# Patient Record
Sex: Female | Born: 1970 | Race: Black or African American | Hispanic: No | Marital: Single | Smoking: Former smoker
Health system: Southern US, Community
[De-identification: ages and names within clinical notes are randomized; demographics above are authoritative.]

## PROBLEM LIST (undated history)

## (undated) DIAGNOSIS — M329 Systemic lupus erythematosus, unspecified: Secondary | ICD-10-CM

## (undated) DIAGNOSIS — IMO0002 Reserved for concepts with insufficient information to code with codable children: Secondary | ICD-10-CM

## (undated) DIAGNOSIS — M25569 Pain in unspecified knee: Secondary | ICD-10-CM

## (undated) DIAGNOSIS — R7303 Prediabetes: Secondary | ICD-10-CM

## (undated) HISTORY — DX: Prediabetes: R73.03

## (undated) HISTORY — DX: Pain in unspecified knee: M25.569

## (undated) HISTORY — DX: Reserved for concepts with insufficient information to code with codable children: IMO0002

## (undated) HISTORY — DX: Systemic lupus erythematosus, unspecified: M32.9

---

## 1995-02-06 HISTORY — PX: KNEE SURGERY: SHX244

## 1996-10-01 DIAGNOSIS — M329 Systemic lupus erythematosus, unspecified: Secondary | ICD-10-CM | POA: Insufficient documentation

## 2010-07-19 ENCOUNTER — Emergency Department (HOSPITAL_BASED_OUTPATIENT_CLINIC_OR_DEPARTMENT_OTHER)
Admission: EM | Admit: 2010-07-19 | Discharge: 2010-07-19 | Disposition: A | Discharge status: Home / Self Care | Payer: 59 | Attending: Emergency Medicine | Admitting: Emergency Medicine

## 2010-07-19 DIAGNOSIS — R21 Rash and other nonspecific skin eruption: Secondary | ICD-10-CM | POA: Insufficient documentation

## 2010-07-19 DIAGNOSIS — M329 Systemic lupus erythematosus, unspecified: Secondary | ICD-10-CM | POA: Insufficient documentation

## 2010-07-19 LAB — URINALYSIS, ROUTINE W REFLEX MICROSCOPIC
Bilirubin Urine: NEGATIVE
Ketones, ur: NEGATIVE mg/dL
Specific Gravity, Urine: 1.006 (ref 1.005–1.030)
Urobilinogen, UA: 0.2 mg/dL (ref 0.0–1.0)
pH: 7 (ref 5.0–8.0)

## 2010-07-19 LAB — BASIC METABOLIC PANEL
BUN: 9 mg/dL (ref 6–23)
CO2: 22 mEq/L (ref 19–32)
Glucose, Bld: 108 mg/dL — ABNORMAL HIGH (ref 70–99)
Potassium: 4.1 mEq/L (ref 3.5–5.1)
Sodium: 134 mEq/L — ABNORMAL LOW (ref 135–145)

## 2010-07-19 LAB — CBC
HCT: 37.1 % (ref 36.0–46.0)
MCH: 28.5 pg (ref 26.0–34.0)
MCV: 82.1 fL (ref 78.0–100.0)
Platelets: 174 10*3/uL (ref 150–400)
RDW: 13.2 % (ref 11.5–15.5)
WBC: 2.6 10*3/uL — ABNORMAL LOW (ref 4.0–10.5)

## 2010-07-19 LAB — DIFFERENTIAL
Eosinophils Absolute: 0 10*3/uL (ref 0.0–0.7)
Eosinophils Relative: 0 % (ref 0–5)
Lymphocytes Relative: 8 % — ABNORMAL LOW (ref 12–46)
Lymphs Abs: 0.2 10*3/uL — ABNORMAL LOW (ref 0.7–4.0)
Monocytes Relative: 4 % (ref 3–12)

## 2011-10-25 DIAGNOSIS — F419 Anxiety disorder, unspecified: Secondary | ICD-10-CM | POA: Insufficient documentation

## 2013-02-05 HISTORY — PX: SHOULDER SURGERY: SHX246

## 2013-03-16 DIAGNOSIS — H40003 Preglaucoma, unspecified, bilateral: Secondary | ICD-10-CM | POA: Insufficient documentation

## 2013-03-16 DIAGNOSIS — H2513 Age-related nuclear cataract, bilateral: Secondary | ICD-10-CM | POA: Insufficient documentation

## 2013-12-08 DIAGNOSIS — M25519 Pain in unspecified shoulder: Secondary | ICD-10-CM | POA: Insufficient documentation

## 2014-01-05 DIAGNOSIS — S43439A Superior glenoid labrum lesion of unspecified shoulder, initial encounter: Secondary | ICD-10-CM | POA: Insufficient documentation

## 2014-01-05 DIAGNOSIS — M24419 Recurrent dislocation, unspecified shoulder: Secondary | ICD-10-CM | POA: Insufficient documentation

## 2014-01-21 DIAGNOSIS — M25312 Other instability, left shoulder: Secondary | ICD-10-CM | POA: Insufficient documentation

## 2014-02-05 HISTORY — PX: UTERINE FIBROID SURGERY: SHX826

## 2015-01-14 DIAGNOSIS — R9431 Abnormal electrocardiogram [ECG] [EKG]: Secondary | ICD-10-CM | POA: Insufficient documentation

## 2020-04-19 DIAGNOSIS — M25562 Pain in left knee: Secondary | ICD-10-CM | POA: Insufficient documentation

## 2020-05-23 DIAGNOSIS — M179 Osteoarthritis of knee, unspecified: Secondary | ICD-10-CM | POA: Insufficient documentation

## 2020-05-23 DIAGNOSIS — Z6841 Body Mass Index (BMI) 40.0 and over, adult: Secondary | ICD-10-CM | POA: Insufficient documentation

## 2020-06-22 ENCOUNTER — Ambulatory Visit: Payer: BC Managed Care – PPO | Admitting: Internal Medicine

## 2020-08-09 ENCOUNTER — Other Ambulatory Visit: Payer: Self-pay | Admitting: Family Medicine

## 2020-08-09 DIAGNOSIS — Z1231 Encounter for screening mammogram for malignant neoplasm of breast: Secondary | ICD-10-CM

## 2020-09-30 ENCOUNTER — Ambulatory Visit: Payer: BC Managed Care – PPO

## 2020-10-06 ENCOUNTER — Ambulatory Visit: Payer: BC Managed Care – PPO

## 2020-11-10 ENCOUNTER — Other Ambulatory Visit: Payer: Self-pay

## 2020-11-10 ENCOUNTER — Ambulatory Visit
Admission: RE | Admit: 2020-11-10 | Discharge: 2020-11-10 | Disposition: A | Discharge status: Home / Self Care | Payer: BC Managed Care – PPO | Source: Ambulatory Visit | Attending: Family Medicine | Admitting: Family Medicine

## 2020-11-10 DIAGNOSIS — Z1231 Encounter for screening mammogram for malignant neoplasm of breast: Secondary | ICD-10-CM

## 2020-12-01 ENCOUNTER — Institutional Professional Consult (permissible substitution): Payer: BC Managed Care – PPO | Admitting: Internal Medicine

## 2020-12-12 ENCOUNTER — Ambulatory Visit (INDEPENDENT_AMBULATORY_CARE_PROVIDER_SITE_OTHER): Payer: BC Managed Care – PPO | Admitting: Pulmonary Disease

## 2020-12-12 ENCOUNTER — Other Ambulatory Visit: Payer: Self-pay

## 2020-12-12 ENCOUNTER — Encounter: Payer: Self-pay | Admitting: Pulmonary Disease

## 2020-12-12 VITALS — BP 122/78 | HR 80 | Temp 98.1°F | Ht 65.0 in | Wt 339.0 lb

## 2020-12-12 DIAGNOSIS — G4733 Obstructive sleep apnea (adult) (pediatric): Secondary | ICD-10-CM | POA: Diagnosis not present

## 2020-12-12 DIAGNOSIS — R0602 Shortness of breath: Secondary | ICD-10-CM | POA: Diagnosis not present

## 2020-12-12 LAB — CBC WITH DIFFERENTIAL/PLATELET
Basophils Absolute: 0 10*3/uL (ref 0.0–0.1)
Basophils Relative: 1.2 % (ref 0.0–3.0)
Eosinophils Absolute: 0 10*3/uL (ref 0.0–0.7)
Eosinophils Relative: 1.4 % (ref 0.0–5.0)
HCT: 37.8 % (ref 36.0–46.0)
Hemoglobin: 12.7 g/dL (ref 12.0–15.0)
Lymphocytes Relative: 53.8 % — ABNORMAL HIGH (ref 12.0–46.0)
Lymphs Abs: 2 10*3/uL (ref 0.7–4.0)
MCHC: 33.8 g/dL (ref 30.0–36.0)
MCV: 83.3 fl (ref 78.0–100.0)
Monocytes Absolute: 0.3 10*3/uL (ref 0.1–1.0)
Monocytes Relative: 9.6 % (ref 3.0–12.0)
Neutro Abs: 1.2 10*3/uL — ABNORMAL LOW (ref 1.4–7.7)
Neutrophils Relative %: 34 % — ABNORMAL LOW (ref 43.0–77.0)
Platelets: 279 10*3/uL (ref 150.0–400.0)
RBC: 4.53 Mil/uL (ref 3.87–5.11)
RDW: 13.8 % (ref 11.5–15.5)
WBC: 3.6 10*3/uL — ABNORMAL LOW (ref 4.0–10.5)

## 2020-12-12 NOTE — Patient Instructions (Signed)
We will check some labs today including CBC with differential, IgE  We will get records from Dr. Kathi Ludwig, rheumatology at [facility]  Schedule pulmonary function test and high-resolution CT for better evaluation of the lungs Schedule home sleep study Follow-up in 2 to 3 months

## 2020-12-12 NOTE — Progress Notes (Addendum)
Julia Booth    [id number]    Sep 14, 1970  Primary Care Physician:Collins, Hinton Dyer, DO  Referring Physician: Janie Morning, [place] [place],  [place] [address]  Chief complaint:  Consult for dyspnea  HPI: 50 year old with history of hypertension, hyperlipidemia, allergies, sleep apnea, lupus Here to establish care Complains of chronic dyspnea on exertion for the past 8 months.  She gets symptomatic when laying down.  She also has dyspnea on exertion with chest tightness.  Occasional wheezing  She had an echocardiogram done which showed normal LV function and has been referred to cardiology History notable for lupus diagnosed in 1998.  She was initially followed by Dr.Semble at [facility] and then followed with a rheumatologist at [facility].  She has reestablished care with Dr. Dossie Der at [facility] recently Lupus was initially treated with steroids in 2019 and has been maintained on Plaquenil.  Symptoms are mostly joint pain and fatigue  She has history of sleep apnea and is on AutoSet CPAP 5-20.  She had a sleep study done many years ago in [place] and does not have a sleep doctor here  She had COVID-19 in March 2022.  She was evaluated at urgent care and did not require hospitalization.  She was put on steroids for 1 week at that time  I have reviewed her primary care, urgent care visit and other records  Pets: 2 turtles.  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  No feather pillows or comforter Smoking history: Smokes 1 to 2 cigarettes/month for stress relief Travel history: Originally from [place].  She moved to [facility] for 6 years until 2021 Relevant family history: Sister had asthma.  No other significant family history of lung disease   Outpatient Encounter Medications as of 12/12/2020  Medication Sig   carvedilol (COREG) 6.25  MG tablet Take 6.25 mg by mouth 2 (two) times daily.   hydroxychloroquine (PLAQUENIL) 200 MG tablet Take 200 mg by mouth 2 (two) times daily.   spironolactone (ALDACTONE) 50 MG tablet Take 50 mg by mouth daily.   traMADol HCl 100 MG TABS tramadol 100 mg tablet   No facility-administered encounter medications on file as of 12/12/2020.    Allergies as of 12/12/2020 - Review Complete 12/12/2020  Allergen Reaction Noted   Penicillins Hives, Other (See Comments), and Rash 04/20/2011   Amoxicillin-pot clavulanate Rash 10/25/2011    No past medical history on file.  History reviewed. No pertinent surgical history.  Family History  Problem Relation Age of Onset   Breast cancer Maternal Grandmother     Social History   Socioeconomic History   Marital status: Single    Spouse name: Not on file   Number of children: Not on file   Years of education: Not on file   Highest education level: Not on file  Occupational History   Not on file  Tobacco Use   Smoking status: Some Days    Types: Cigarettes   Smokeless tobacco: Never   Tobacco comments:    Smokes with stress- 1 time a month average  Substance and Sexual Activity   Alcohol use: Not on file   Drug use: Not on file   Sexual activity: Not on file  Other Topics Concern   Not on file  Social History Narrative   Not on file   Social Determinants of Health  Financial Resource Strain: Not on file  Food Insecurity: Not on file  Transportation Needs: Not on file  Physical Activity: Not on file  Stress: Not on file  Social Connections: Not on file  Intimate Partner Violence: Not on file    Review of systems: Review of Systems  Constitutional: Negative for fever and chills.  HENT: Negative.   Eyes: Negative for blurred vision.  Respiratory: as per HPI  Cardiovascular: Negative for chest pain and palpitations.  Gastrointestinal: Negative for vomiting, diarrhea, blood per rectum. Genitourinary: Negative for dysuria,  urgency, frequency and hematuria.  Musculoskeletal: Negative for myalgias, back pain and joint pain.  Skin: Negative for itching and rash.  Neurological: Negative for dizziness, tremors, focal weakness, seizures and loss of consciousness.  Endo/Heme/Allergies: Negative for environmental allergies.  Psychiatric/Behavioral: Negative for depression, suicidal ideas and hallucinations.  All other systems reviewed and are negative.  Physical Exam: Blood pressure 122/78, pulse 80, temperature 98.1 F (36.7 C), temperature source Oral, height 5\' 5"  (1.651 m), weight (!) 339 lb (153.8 kg), SpO2 98 %. Gen:      No acute distress HEENT:  EOMI, sclera anicteric Neck:     No masses; no thyromegaly Lungs:    Clear to auscultation bilaterally; normal respiratory effort CV:         Regular rate and rhythm; no murmurs Abd:      + bowel sounds; soft, non-tender; no palpable masses, no distension Ext:    No edema; adequate peripheral perfusion Skin:      Warm and dry; no rash Neuro: alert and oriented x 3 Psych: normal mood and affect  Data Reviewed: Imaging:  PFTs:  Labs:  Cardiac: Echocardiogram 05/19/2020 Outside report Normal LV size, normal diastolic filling and systolic function.  Calculated EF 75% Normal RV size.  LA and RA cavity is mildly dilated  Assessment:  Consult for dyspnea Likely has multifactorial etiology of dyspnea Will need evaluation for reactive airway disease, lupus ILD or post-COVID syndrome Check CBC with differential, IgE Schedule PFTs and high-res CT Obtain records from Dr. 05/21/2020, rheumatology  We discussed empiric use of inhalers but have decided to hold off until we are able to review the data Encouraged weight loss with diet and exercise as body habitus is playing a role in presentation  OSA Maintained on CPAP She would like to transfer OSA care to Kathi Ludwig Order home sleep study for evaluation as the last sleep test was done many years ago in  [place]  Plan/Recommendations: Obtain rheumatology records CBC, IgE PFTs high-res CT Weight loss Home sleep study  [place] MD [facility] 12/12/2020, 9:01 AM  CC: 13/08/2020, DO  Addendum: Received note from Dr. Irena Reichmann, rheumatology dated 12/12/2020 She has a diagnosis of lupus, connective tissue disease overlap syndrome Checking lupus markers Trial of prednisone starting at 30 mg with taper Starting Benlysta.  Labs ANA positive RNP antibodies greater than 8  13/08/2020 MD [facility] 12/17/2020, 4:48 PM

## 2020-12-13 LAB — IGE: IgE (Immunoglobulin E), Serum: 159 kU/L — ABNORMAL HIGH (ref <=114)

## 2020-12-20 ENCOUNTER — Telehealth: Payer: Self-pay | Admitting: Pulmonary Disease

## 2020-12-20 NOTE — Telephone Encounter (Signed)
Labs are normal except for elevated IgE which may indicate allergies. No change in management   I have called the pt and LM on VM for her to call back.

## 2020-12-21 NOTE — Telephone Encounter (Signed)
Patient is returning phone call. Patient phone number is [phone]. May leave detailed message on voicemail.

## 2020-12-21 NOTE — Telephone Encounter (Signed)
I have called the pt and she is aware of results per PM>  nothing further is needed.

## 2021-01-02 ENCOUNTER — Ambulatory Visit
Admission: RE | Admit: 2021-01-02 | Discharge: 2021-01-02 | Disposition: A | Discharge status: Home / Self Care | Payer: BC Managed Care – PPO | Source: Ambulatory Visit | Attending: Pulmonary Disease | Admitting: Pulmonary Disease

## 2021-01-02 DIAGNOSIS — R0602 Shortness of breath: Secondary | ICD-10-CM

## 2021-01-05 ENCOUNTER — Other Ambulatory Visit: Payer: Self-pay

## 2021-01-05 ENCOUNTER — Ambulatory Visit (HOSPITAL_BASED_OUTPATIENT_CLINIC_OR_DEPARTMENT_OTHER): Payer: BC Managed Care – PPO | Admitting: Cardiovascular Disease

## 2021-01-05 ENCOUNTER — Encounter (HOSPITAL_BASED_OUTPATIENT_CLINIC_OR_DEPARTMENT_OTHER): Payer: Self-pay | Admitting: Cardiovascular Disease

## 2021-01-05 VITALS — BP 124/90 | HR 73 | Ht 65.0 in | Wt 349.0 lb

## 2021-01-05 DIAGNOSIS — I10 Essential (primary) hypertension: Secondary | ICD-10-CM | POA: Diagnosis not present

## 2021-01-05 DIAGNOSIS — R0609 Other forms of dyspnea: Secondary | ICD-10-CM | POA: Diagnosis not present

## 2021-01-05 DIAGNOSIS — I272 Pulmonary hypertension, unspecified: Secondary | ICD-10-CM | POA: Insufficient documentation

## 2021-01-05 DIAGNOSIS — G4733 Obstructive sleep apnea (adult) (pediatric): Secondary | ICD-10-CM

## 2021-01-05 DIAGNOSIS — Z5181 Encounter for therapeutic drug level monitoring: Secondary | ICD-10-CM

## 2021-01-05 DIAGNOSIS — R079 Chest pain, unspecified: Secondary | ICD-10-CM

## 2021-01-05 HISTORY — DX: Pulmonary hypertension, unspecified: I27.20

## 2021-01-05 HISTORY — DX: Obstructive sleep apnea (adult) (pediatric): G47.33

## 2021-01-05 HISTORY — DX: Essential (primary) hypertension: I10

## 2021-01-05 HISTORY — DX: Other forms of dyspnea: R06.09

## 2021-01-05 MED ORDER — FUROSEMIDE 20 MG PO TABS: 20.0000 mg | ORAL_TABLET | Freq: Every day | ORAL | 3 refills | Status: DC

## 2021-01-05 MED ORDER — METOPROLOL TARTRATE 100 MG PO TABS: ORAL_TABLET | ORAL | 0 refills | Status: DC

## 2021-01-05 NOTE — Assessment & Plan Note (Signed)
Blood pressure is well-controlled.  Continue carvedilol and spironolactone.

## 2021-01-05 NOTE — Assessment & Plan Note (Signed)
Pulmonary hypertension noted on echo.  There is a concern for ILD on chest CT.  She is working with pulmonary to work this up.  She is also using her CPAP regularly for OSA.  Given that she is had some lower extremity edema and her right atrial pressure was 8 mmHg on echo, we will start furosemide 20 mg daily.  Check a basic metabolic panel in a week and keep following up with pulmonary.

## 2021-01-05 NOTE — Assessment & Plan Note (Signed)
I suspect this is more related to her underlying lung disease, pulmonary hypertension, and morbid obesity.  However she also has some chest tightness.  We will get a coronary CTA to rule out obstructive coronary disease.  No significant calcification was noted on chest CT.

## 2021-01-05 NOTE — Progress Notes (Signed)
Cardiology Office Note:    Date:  01/05/2021   ID:  Julia Booth, DOB 1970/06/09, MRN [id number]  PCP:  Julia Reichmann, DO   [facility] Providers Cardiologist:  None     Referring MD: Julia Reichmann, DO   No chief complaint on file.  History of Present Illness:    Julia Booth is a 50 y.o. female with a hx of Lupus, hypertension, hyperlipidemia, and OSA, here for the evaluation of shortness of breath and edema. She saw Dr. Isaiah Booth 12/2020 and complained of dyspnea for the preceding 8 months. She also reported exertional dyspnea, chest tightness, and occasional wheezing. She reportedly had an Echo that revealed normal systolic function. She was dx with Lupus in 1998 and was seen by Dr. Janyth Booth at [facility]. She had COVID-19 on 04/2020. He thought her dyspnea was multifactorial. He was concerned for reactive airway disease, Lupus ILD, and post-COVID syndrome. She had a chest CT last week which showed early/mild ILD and was indeterminate for UIT. She had PFT's and a sleep test was ordered but has not been performed.  Today, she reports her shortness of breath has been ongoing periodically for 2 years. Earlier in March 2022 her shortness of breath was more frequent and associated with chest pains. Lately her episodes have been occurring mostly when she is lying down, or while sitting on the couch. She often needs to change position at night due to orthopnea. Her LE edema also began in the past 2 years, but usually improves by the next day. Additionally, she endorses myalgias due to Lupus. For exercise she walks her dog twice a day, and is on her recumbent bike for 30 minutes a day. While walking she will also become short of breath. However, she is able to push through if she rests for a short time. If she becomes very fatigued and short of breath, she also notices mucus production. Occasionally she smokes when she is stressed. A pack of cigarettes may last her for 2-3 months. She affirms  snoring, and uses her CPAP regularly. Last month her spironolactone was increased to 50 mg. She has not noticed much of a difference in terms of urinary frequency. She denies any palpitations. No lightheadedness, headaches, syncope, or PND.  Past Medical History:  Diagnosis Date   Essential hypertension 01/05/2021   Exertional dyspnea 01/05/2021   OSA (obstructive sleep apnea) 01/05/2021   Pulmonary hypertension, unspecified (HCC) 01/05/2021    History reviewed. No pertinent surgical history.  Current Medications: Current Meds  Medication Sig   carvedilol (COREG) 6.25 MG tablet Take 6.25 mg by mouth 2 (two) times daily.   furosemide (LASIX) 20 MG tablet Take 1 tablet (20 mg total) by mouth daily.   hydroxychloroquine (PLAQUENIL) 200 MG tablet Take 200 mg by mouth 2 (two) times daily.   metoprolol tartrate (LOPRESSOR) 100 MG tablet Take 1 tablet 2 hours prior to CT   spironolactone (ALDACTONE) 50 MG tablet Take 50 mg by mouth daily.   traMADol HCl 100 MG TABS tramadol 100 mg tablet     Allergies:   Penicillins and Amoxicillin-pot clavulanate   Social History   Socioeconomic History   Marital status: Single    Spouse name: Not on file   Number of children: Not on file   Years of education: Not on file   Highest education level: Not on file  Occupational History   Not on file  Tobacco Use   Smoking status: Some Days    Types: Cigarettes  Smokeless tobacco: Never   Tobacco comments:    Smokes with stress- 1 time a month average  Substance and Sexual Activity   Alcohol use: Not on file   Drug use: Not on file   Sexual activity: Not on file  Other Topics Concern   Not on file  Social History Narrative   Not on file   Social Determinants of Health   Financial Resource Strain: Low Risk    Difficulty of Paying Living Expenses: Not hard at all  Food Insecurity: No Food Insecurity   Worried About Running Out of Food in the Last Year: Never true   Ran Out of Food in the Last  Year: Never true  Transportation Needs: No Transportation Needs   Lack of Transportation (Medical): No   Lack of Transportation (Non-Medical): No  Physical Activity: Insufficiently Active   Days of Exercise per Week: 3 days   Minutes of Exercise per Session: 30 min  Stress: Not on file  Social Connections: Not on file     Family History: The patient's family history includes Breast cancer in her maternal grandmother; Diabetes in her father, maternal aunt, maternal grandmother, mother, and sister; Heart attack in her maternal aunt and maternal grandmother; Hypertension in her father, maternal aunt, maternal grandmother, mother, and sister; Stroke in her father.  ROS:   Please see the history of present illness.    (+) Shortness of breath (+) Orthopnea (+) Bilateral LE edema (+) Chest pain (+) Myalgias (+) Snoring All other systems reviewed and are negative.  EKGs/Labs/Other Studies Reviewed:    The following studies were reviewed today:  Echo 05/19/20: LVEF 76%.  Normal diastolic function.  LA mildly dilated. Normal RV function.  RVSP 44.6 mmHg.  Trivial MR, trivial AR.  CT Chest 01/02/2021: FINDINGS: Cardiovascular: Atherosclerotic calcification of the aorta and aortic valve. Heart size normal. No pericardial effusion.   Mediastinum/Nodes: No pathologically enlarged mediastinal lymph nodes. Hilar regions are difficult to definitively evaluate without IV contrast. Subpectoral and axillary lymph nodes are subcentimeter in size. Esophagus is grossly unremarkable.   Lungs/Pleura: Minimal subpleural reticular densities in right upper lobe. Minimal ground-glass and questionable subpleural reticular densities in the inferior right lower lobe and to a lesser extent, inferior left lower lobe. Probable 2 mm smudgy subpleural lymph node along the minor fissure (8/68). No pleural fluid. Airway is unremarkable. There is air trapping.   Upper Abdomen: Visualized portions of the  liver, gallbladder, adrenal glands, kidneys, spleen, pancreas, stomach and bowel are grossly unremarkable. No upper abdominal adenopathy.   Musculoskeletal: Probable hemangioma in the T10 vertebral body. No worrisome lytic or sclerotic lesions.   IMPRESSION: 1. Pulmonary parenchymal findings, as described above, are relatively nonspecific. Early/mild interstitial lung disease such as nonspecific interstitial pneumonitis cannot be excluded. Future evaluation with prone imaging would be helpful. Findings are indeterminate for UIP per consensus guidelines: Diagnosis of Idiopathic Pulmonary Fibrosis: An Official ATS/ERS/JRS/ALAT Clinical Practice Guideline. Am Rosezetta Schlatter Crit Care Med Vol 198, Iss 5, [phone], Oct 06 2016. 2. 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 3.  Aortic atherosclerosis (ICD10-I70.0).  EKG:    01/05/2021: Sinus rhythm. Rate 73 bpm. RSR' in lead V1.  Recent Labs: 12/12/2020: Hemoglobin 12.7; Platelets 279.0   Recent Lipid Panel No results found for: CHOL, TRIG, HDL, CHOLHDL, VLDL, LDLCALC, LDLDIRECT   Physical Exam:    Wt Readings from Last 3 Encounters:  01/05/21 (!) 349 lb (158.3 kg)  12/12/20 (!) 339 lb (153.8 kg)  VS:  BP 124/90 (BP Location: Right Arm, Patient Position: Sitting, Cuff Size: Large)   Pulse 73   Ht 5\' 5"  (1.651 m)   Wt (!) 349 lb (158.3 kg)   BMI 58.08 kg/m  , BMI Body mass index is 58.08 kg/m. GENERAL:  Well appearing HEENT: Pupils equal round and reactive, fundi not visualized, oral mucosa unremarkable NECK:  No jugular venous distention, waveform within normal limits, carotid upstroke brisk and symmetric, no bruits, no thyromegaly LUNGS:  Clear to auscultation bilaterally HEART:  RRR.  PMI not displaced or sustained,S1 and S2 within normal limits, no S3, no S4, no clicks, no rubs, no murmurs ABD:  Flat, positive bowel sounds normal in frequency in pitch, no bruits, no rebound, no guarding, no midline pulsatile mass, no  hepatomegaly, no splenomegaly EXT:  2 plus pulses throughout, no edema, no cyanosis no clubbing SKIN:  No rashes no nodules NEURO:  Cranial nerves II through XII grossly intact, motor grossly intact throughout PSYCH:  Cognitively intact, oriented to person place and time   ASSESSMENT:    1. Therapeutic drug monitoring   2. Essential hypertension   3. Pulmonary hypertension, unspecified (HCC)   4. Exertional dyspnea   5. OSA (obstructive sleep apnea)   6. Chest pain of uncertain etiology    PLAN:    Essential hypertension Blood pressure is well-controlled.  Continue carvedilol and spironolactone.  Pulmonary hypertension, unspecified (HCC) Pulmonary hypertension noted on echo.  There is a concern for ILD on chest CT.  She is working with pulmonary to work this up.  She is also using her CPAP regularly for OSA.  Given that she is had some lower extremity edema and her right atrial pressure was 8 mmHg on echo, we will start furosemide 20 mg daily.  Check a basic metabolic panel in a week and keep following up with pulmonary.  Exertional dyspnea I suspect this is more related to her underlying lung disease, pulmonary hypertension, and morbid obesity.  However she also has some chest tightness.  We will get a coronary CTA to rule out obstructive coronary disease.  No significant calcification was noted on chest CT.  OSA (obstructive sleep apnea) Continue CPAP.  She has been seen by pulmonary and they will establish care with a sleep provider.       Disposition: FU with Penina Reisner C. , MD, [facility] in 2-3 months.  Medication Adjustments/Labs and Tests Ordered: Current medicines are reviewed at length with the patient today.  Concerns regarding medicines are outlined above.   Orders Placed This Encounter  Procedures   CT CORONARY MORPH W/CTA COR W/SCORE W/CA W/CM &/OR WO/CM   Basic metabolic panel   EKG 12-Lead    Meds ordered this encounter  Medications   metoprolol tartrate  (LOPRESSOR) 100 MG tablet    Sig: Take 1 tablet 2 hours prior to CT    Dispense:  1 tablet    Refill:  0   furosemide (LASIX) 20 MG tablet    Sig: Take 1 tablet (20 mg total) by mouth daily.    Dispense:  90 tablet    Refill:  3    Patient Instructions  Medication Instructions:  TAKE METOPROLOL 100 MG 2 HOURS PRIOR TO CT  *If you need a refill on your cardiac medications before your next appointment, please call your pharmacy*  Lab Work: BMET 1 WEEK   If you have labs (blood work) drawn today and your tests are completely normal, you will receive your  results only by: MyChart Message (if you have MyChart) OR A paper copy in the mail If you have any lab test that is abnormal or we need to change your treatment, we will call you to review the results.  Testing/Procedures: Your physician has requested that you have cardiac CT. Cardiac computed tomography (CT) is a painless test that uses an x-ray machine to take clear, detailed pictures of your heart. For further information please visit [email or web address] Please follow instruction sheet as given. ONCE INSURANCE HAS BEEN REVIEWED THE OFFICE WILL CALL TO ARRANGE. IF YOU DO NOT HEAR IN 2 WEEKS CALL THE OFFICE TO FOLLOW UP   Follow-Up: At [facility], you and your health needs are our priority.  As part of our continuing mission to provide you with exceptional heart care, we have created designated Provider Care Teams.  These Care Teams include your primary Cardiologist (physician) and Advanced Practice Providers (APPs -  Physician Assistants and Nurse Practitioners) who all work together to provide you with the care you need, when you need it.  We recommend signing up for the patient portal called "MyChart".  Sign up information is provided on this After Visit Summary.  MyChart is used to connect with patients for Virtual Visits (Telemedicine).  Patients are able to view lab/test results, encounter notes, upcoming appointments, etc.   Non-urgent messages can be sent to your provider as well.   To learn more about what you can do with MyChart, go to [email or web address].    Your next appointment:   3  month(s)  The format for your next appointment:   In Person  Provider:   Chilton Si, MD    Other Instructions    Your cardiac CT will be scheduled at one of the below locations:   [facility] [address] [place], [place] [address] [phone]  OR  [facility] [address] [place], [place] [address] [phone]  If scheduled at [facility], please arrive at the [facility] main entrance (entrance A) of [facility] 30 minutes prior to test start time. You can use the FREE valet parking offered at the main entrance (encouraged to control the heart rate for the test) Proceed to the [facility] Radiology Department (first floor) to check-in and test prep.  If scheduled at [facility], please arrive 15 mins early for check-in and test prep.  Please follow these instructions carefully (unless otherwise directed):  Hold all erectile dysfunction medications at least 3 days (72 hrs) prior to test.  On the Night Before the Test: Be sure to Drink plenty of water. Do not consume any caffeinated/decaffeinated beverages or chocolate 12 hours prior to your test. Do not take any antihistamines 12 hours prior to your test. If the patient has contrast allergy: Patient will need a prescription for Prednisone and very clear instructions (as follows): Prednisone 50 mg - take 13 hours prior to test Take another Prednisone 50 mg 7 hours prior to test Take another Prednisone 50 mg 1 hour prior to test Take Benadryl 50 mg 1 hour prior to test Patient must complete all four doses of above prophylactic medications. Patient will need a ride after test due to Benadryl.  On the Day of the Test: Drink plenty of  water until 1 hour prior to the test. Do not eat any food 4 hours prior to the test. You may take your regular medications prior to the test.  Take  metoprolol (Lopressor) two hours prior to test. HOLD Furosemide/Hydrochlorothiazide morning of the test. FEMALES- please wear underwire-free bra if available, avoid dresses & tight clothing    After the Test: Drink plenty of water. After receiving IV contrast, you may experience a mild flushed feeling. This is normal. On occasion, you may experience a mild rash up to 24 hours after the test. This is not dangerous. If this occurs, you can take Benadryl 25 mg and increase your fluid intake. If you experience trouble breathing, this can be serious. If it is severe call 911 IMMEDIATELY. If it is mild, please call our office. If you take any of these medications: Glipizide/Metformin, Avandament, Glucavance, please do not take 48 hours after completing test unless otherwise instructed.  Please allow 2-4 weeks for scheduling of routine cardiac CTs. Some insurance companies require a pre-authorization which may delay scheduling of this test.   For non-scheduling related questions, please contact the cardiac imaging nurse navigator should you have any questions/concerns: Rockwell Alexandria, Cardiac Imaging Nurse Navigator Larey Brick, Cardiac Imaging Nurse Navigator [facility] Direct Office Dial: [phone]   For scheduling needs, including cancellations and rescheduling, please call [place], [phone].      I,Mathew Stumpf,acting as a Neurosurgeon for Chilton Si, MD.,have documented all relevant documentation on the behalf of Chilton Si, MD,as directed by  Chilton Si, MD while in the presence of Chilton Si, MD.  I, Braidan Ricciardi C. Duke Salvia, MD have reviewed all documentation for this visit.  The documentation of the exam, diagnosis, procedures, and orders on 01/05/2021 are all accurate and  complete.   Signed, Chilton Si, MD  01/05/2021 6:18 PM     [facility]

## 2021-01-05 NOTE — Patient Instructions (Addendum)
Medication Instructions:  TAKE METOPROLOL 100 MG 2 HOURS PRIOR TO CT  *If you need a refill on your cardiac medications before your next appointment, please call your pharmacy*  Lab Work: BMET 1 WEEK   If you have labs (blood work) drawn today and your tests are completely normal, you will receive your results only by: MyChart Message (if you have MyChart) OR A paper copy in the mail If you have any lab test that is abnormal or we need to change your treatment, we will call you to review the results.  Testing/Procedures: Your physician has requested that you have cardiac CT. Cardiac computed tomography (CT) is a painless test that uses an x-ray machine to take clear, detailed pictures of your heart. For further information please visit [email or web address] Please follow instruction sheet as given. ONCE INSURANCE HAS BEEN REVIEWED THE OFFICE WILL CALL TO ARRANGE. IF YOU DO NOT HEAR IN 2 WEEKS CALL THE OFFICE TO FOLLOW UP   Follow-Up: At [facility], you and your health needs are our priority.  As part of our continuing mission to provide you with exceptional heart care, we have created designated Provider Care Teams.  These Care Teams include your primary Cardiologist (physician) and Advanced Practice Providers (APPs -  Physician Assistants and Nurse Practitioners) who all work together to provide you with the care you need, when you need it.  We recommend signing up for the patient portal called "MyChart".  Sign up information is provided on this After Visit Summary.  MyChart is used to connect with patients for Virtual Visits (Telemedicine).  Patients are able to view lab/test results, encounter notes, upcoming appointments, etc.  Non-urgent messages can be sent to your provider as well.   To learn more about what you can do with MyChart, go to [email or web address].    Your next appointment:   3  month(s)  The format for your next appointment:   In Person  Provider:   Chilton Si, MD    Other Instructions    Your cardiac CT will be scheduled at one of the below locations:   [facility] [address] [place], [place] [address] [phone]  OR  [facility] [address] [place], [place] [address] [phone]  If scheduled at [facility], please arrive at the [facility] main entrance (entrance A) of [facility] 30 minutes prior to test start time. You can use the FREE valet parking offered at the main entrance (encouraged to control the heart rate for the test) Proceed to the [facility] Radiology Department (first floor) to check-in and test prep.  If scheduled at [facility], please arrive 15 mins early for check-in and test prep.  Please follow these instructions carefully (unless otherwise directed):  Hold all erectile dysfunction medications at least 3 days (72 hrs) prior to test.  On the Night Before the Test: Be sure to Drink plenty of water. Do not consume any caffeinated/decaffeinated beverages or chocolate 12 hours prior to your test. Do not take any antihistamines 12 hours prior to your test. If the patient has contrast allergy: Patient will need a prescription for Prednisone and very clear instructions (as follows): Prednisone 50 mg - take 13 hours prior to test Take another Prednisone 50 mg 7 hours prior to test Take another Prednisone 50 mg 1 hour prior to test Take Benadryl 50 mg 1 hour prior to test Patient must complete all four doses  of above prophylactic medications. Patient will need a ride after test due to Benadryl.  On the Day of the Test: Drink plenty of water until 1 hour prior to the test. Do not eat any food 4 hours prior to the test. You may take your regular medications prior to the test.  Take metoprolol (Lopressor) two hours prior to test. HOLD Furosemide/Hydrochlorothiazide morning of the  test. FEMALES- please wear underwire-free bra if available, avoid dresses & tight clothing    After the Test: Drink plenty of water. After receiving IV contrast, you may experience a mild flushed feeling. This is normal. On occasion, you may experience a mild rash up to 24 hours after the test. This is not dangerous. If this occurs, you can take Benadryl 25 mg and increase your fluid intake. If you experience trouble breathing, this can be serious. If it is severe call 911 IMMEDIATELY. If it is mild, please call our office. If you take any of these medications: Glipizide/Metformin, Avandament, Glucavance, please do not take 48 hours after completing test unless otherwise instructed.  Please allow 2-4 weeks for scheduling of routine cardiac CTs. Some insurance companies require a pre-authorization which may delay scheduling of this test.   For non-scheduling related questions, please contact the cardiac imaging nurse navigator should you have any questions/concerns: Rockwell Alexandria, Cardiac Imaging Nurse Navigator Larey Brick, Cardiac Imaging Nurse Navigator [facility] Direct Office Dial: [phone]   For scheduling needs, including cancellations and rescheduling, please call [place], [phone].

## 2021-01-05 NOTE — Assessment & Plan Note (Signed)
Continue CPAP.  She has been seen by pulmonary and they will establish care with a sleep provider.

## 2021-01-06 ENCOUNTER — Telehealth: Payer: Self-pay | Admitting: Cardiovascular Disease

## 2021-01-06 NOTE — Telephone Encounter (Signed)
Judeth Cornfield from [facility] is calling regarding getting PA for analysis of data from CT Study of heart blood vessels to access severity of heart artery disease  Scheduled with [facility] has been approved CT of blood vessels and graph of heart with contrast scheduled with [facility] already approved  Auth# [id number]

## 2021-01-06 NOTE — Telephone Encounter (Signed)
Will forward to precert department  

## 2021-01-06 NOTE — Telephone Encounter (Signed)
Help.

## 2021-01-09 ENCOUNTER — Encounter: Payer: Self-pay | Admitting: Pulmonary Disease

## 2021-01-09 NOTE — Telephone Encounter (Signed)
Hello [name]  I reviewed the CT scan.  There are very minimal changes at the base which looks like subtle inflammation.  This could be residual of her prior COVID infection.  In addition there are changes suggestive of mild asthma and plaque buildup in the blood vessels of the heart.  Overall the findings are not very concerning.  I will discuss in detail at time of return visit with PFTs next month  Julia Greathouse MD [facility] 01/09/2021, 11:48 AM

## 2021-01-09 NOTE — Telephone Encounter (Signed)
PM please advise. Thanks  

## 2021-01-16 ENCOUNTER — Telehealth (HOSPITAL_COMMUNITY): Payer: Self-pay | Admitting: Emergency Medicine

## 2021-01-16 NOTE — Telephone Encounter (Signed)
Attempted to call patient regarding upcoming cardiac CT appointment. Left message on voicemail with name and callback number Makeda Peeks RN Navigator Cardiac Imaging [facility] [phone] Office [phone] Cell  Reminded to get labs 

## 2021-01-18 ENCOUNTER — Encounter (HOSPITAL_COMMUNITY): Payer: Self-pay

## 2021-01-18 ENCOUNTER — Ambulatory Visit (HOSPITAL_COMMUNITY)
Admission: RE | Admit: 2021-01-18 | Discharge: 2021-01-18 | Disposition: A | Discharge status: Home / Self Care | Payer: BC Managed Care – PPO | Source: Ambulatory Visit | Attending: Cardiovascular Disease | Admitting: Cardiovascular Disease

## 2021-01-18 ENCOUNTER — Other Ambulatory Visit: Payer: Self-pay

## 2021-01-18 DIAGNOSIS — I10 Essential (primary) hypertension: Secondary | ICD-10-CM | POA: Insufficient documentation

## 2021-01-18 DIAGNOSIS — R0609 Other forms of dyspnea: Secondary | ICD-10-CM | POA: Insufficient documentation

## 2021-01-18 DIAGNOSIS — I700 Atherosclerosis of aorta: Secondary | ICD-10-CM | POA: Diagnosis not present

## 2021-01-18 DIAGNOSIS — R079 Chest pain, unspecified: Secondary | ICD-10-CM | POA: Diagnosis present

## 2021-01-18 DIAGNOSIS — Z5181 Encounter for therapeutic drug level monitoring: Secondary | ICD-10-CM | POA: Diagnosis present

## 2021-01-18 LAB — POCT I-STAT CREATININE: Creatinine, Ser: 0.7 mg/dL (ref 0.44–1.00)

## 2021-01-18 IMAGING — CT CT HEART MORP W/ CTA COR W/ SCORE W/ CA W/CM &/OR W/O CM
4 of 7 series · 8 of 20 positions shown, 9 images · IV contrast (omnipaque)
Comparison: [DATE] high-resolution chest CT.
COMPARISON: [DATE] high-resolution chest CT.

Addendum:
EXAM:
OVER-READ INTERPRETATION  CT CHEST

The following report is an over-read performed by radiologist Dr.
over-read does not include interpretation of cardiac or coronary
anatomy or pathology. The coronary CTA interpretation by the
cardiologist is attached.
HISTORY: 50 yo female with dyspnea on exertion (BULMARO)
Cardiac/Coronary CTA
TECHNIQUE: The patient was scanned on a Siemens Force scanner.
PROTOCOL: A 110 kV prospective scan was triggered in the descending thoracic
aorta at 111 HU's. Axial non-contrast 3 mm slices were carried out
through the heart. The data set was analyzed on a dedicated work
station and scored using the Agatson method. Gantry rotation speed
was 250 msecs and collimation was .6 mm. Beta blockade and 0.8 mg of
sl NTG was given. The 3D data set was reconstructed in 5% intervals
of the 35-75 % of the R-R cycle. Diastolic phases were analyzed on a
dedicated work station using MPR, MIP and VRT modes. The patient
received 100mL OMNIPAQUE IOHEXOL 350 MG/ML SOLN of contrast.

[Series 6: ts diast sharp · axial · 0.38mm/px · z∈[-133,-91]mm · 2 of 316 slices shown]
[im 106/316  lung]
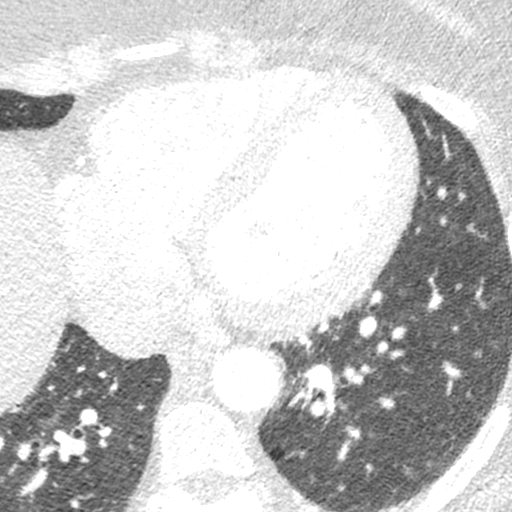
[im 211/316  lung]
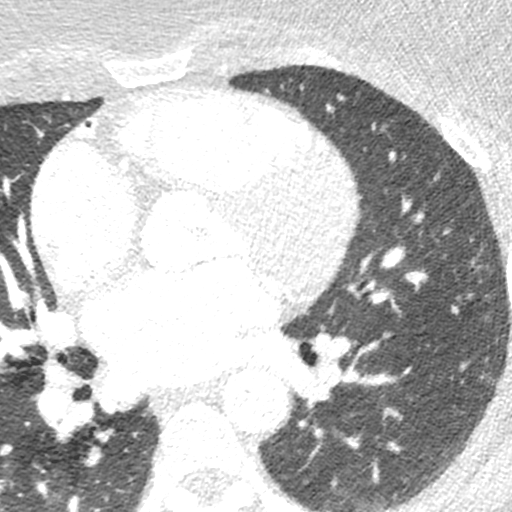

[Series 7: ts syst sharp · axial · 0.38mm/px · z∈[-133,-91]mm · 2 of 316 slices shown]
[im 106/316  lung]
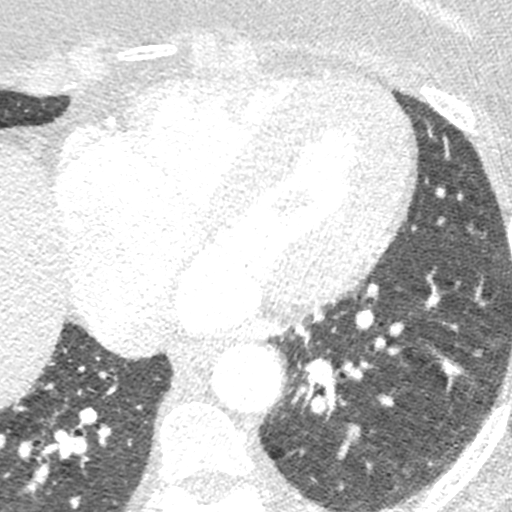
[im 211/316  lung]
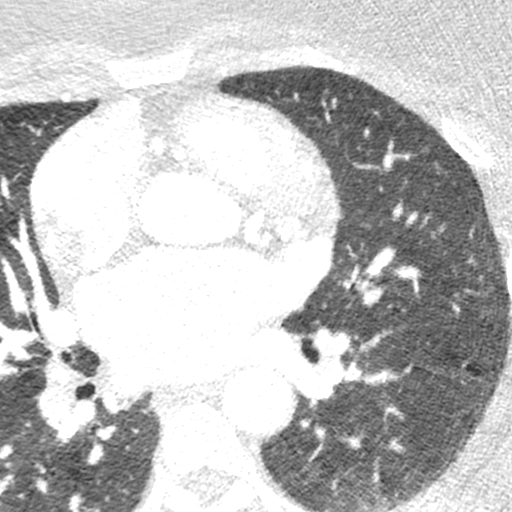

[Series 8: best syst · axial · 0.38mm/px · z∈[-133,-91]mm · 2 of 316 slices shown, 3 images]
[im 106/316  vessel]
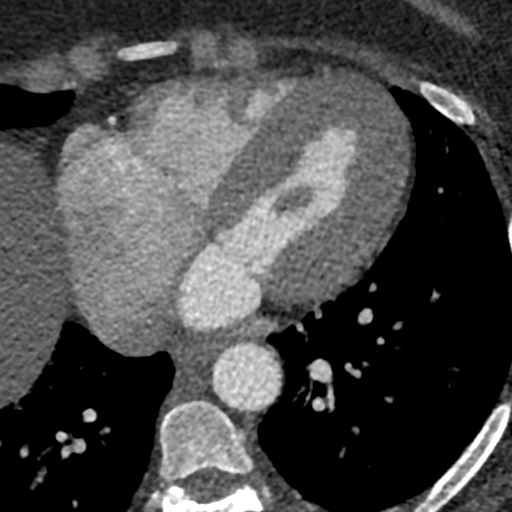
[im 106/316  lung]
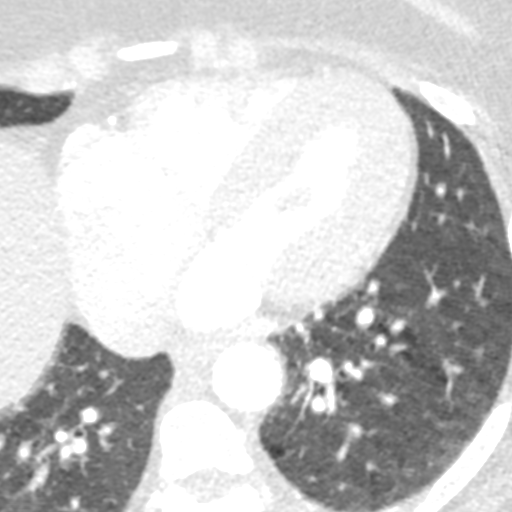
[im 211/316  vessel]
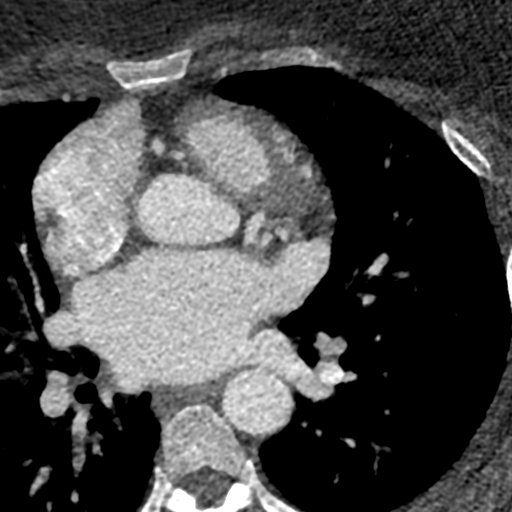

[Series 9: best diast · axial · 0.38mm/px · z∈[-133,-91]mm · 2 of 316 slices shown]
[im 106/316  vessel]
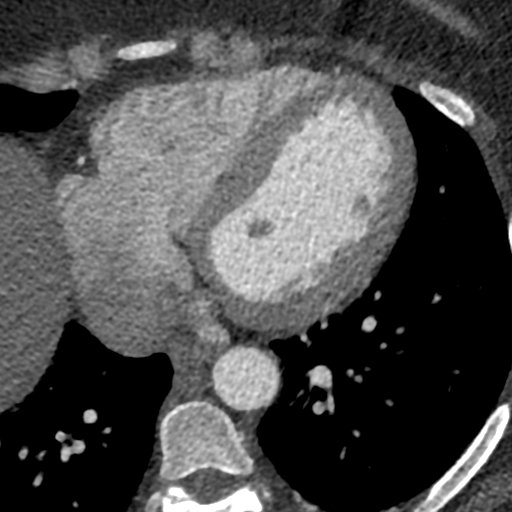
[im 211/316  vessel]
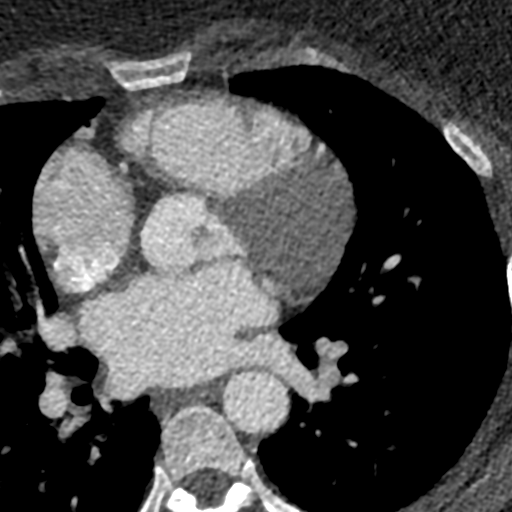

[8 of 20 positions shown; findings below may reference images not displayed]

FINDINGS: Vascular: Aortic atherosclerosis. No central pulmonary embolism, on
this non-dedicated study.

Mediastinum/Nodes: No imaged thoracic adenopathy.

Lungs/Pleura: No pleural fluid.  Right lower lobe scarring.

Upper Abdomen: Normal imaged portions of the liver, stomach.

Musculoskeletal: No acute osseous abnormality.
IMPRESSION: No acute findings in the imaged extracardiac chest.

Aortic Atherosclerosis ([8P]-[8P]).
FINDINGS: Quality: Fair, attenuation artifact, bolus timing off, HR 63

Coronary calcium score: The patient's coronary artery calcium score
is 0, which places the patient in the 0 percentile.

Coronary arteries: Normal coronary origins.  Right dominance.

Right Coronary Artery: Dominant.  Normal artery.

Left Main Coronary Artery: Normal. Bifurcates into the LAD and LCx
arteries.

Left Anterior Descending Coronary Artery: Large anterior artery that
extends around the apex. No disease.

Left Circumflex Artery: Tortuous, lateral vessel without disease.
Small distal OM branch without disease.

Aorta: Normal size, 28 mm at the mid ascending aorta (level of the
PA bifurcation) measured double oblique. Aortic atherosclerosis. No
dissection.

Aortic Valve: Trileaflet. No calcifications.

Other findings:

Normal pulmonary vein drainage into the left atrium.

Normal left atrial appendage without a thrombus.

Normal size of the pulmonary artery.
IMPRESSION: 1. No evidence of CAD, CADRADS = 0.

2. Coronary calcium score of 0. This was 0 percentile for age and
sex matched control.

3. Normal coronary origin with right dominance.

4. Aortic atherosclersosis.

5. Consider non-coronary causes of dyspnea.

*** End of Addendum ***
EXAM:
OVER-READ INTERPRETATION  CT CHEST

The following report is an over-read performed by radiologist Dr.
over-read does not include interpretation of cardiac or coronary
anatomy or pathology. The coronary CTA interpretation by the
cardiologist is attached.
FINDINGS: Vascular: Aortic atherosclerosis. No central pulmonary embolism, on
this non-dedicated study.

Mediastinum/Nodes: No imaged thoracic adenopathy.

Lungs/Pleura: No pleural fluid.  Right lower lobe scarring.

Upper Abdomen: Normal imaged portions of the liver, stomach.

Musculoskeletal: No acute osseous abnormality.
IMPRESSION: No acute findings in the imaged extracardiac chest.

Aortic Atherosclerosis ([8P]-[8P]).

## 2021-01-18 MED ORDER — IOHEXOL 350 MG/ML SOLN
100.0000 mL | Freq: Once | INTRAVENOUS | Status: AC | PRN
  Administered 2021-01-18: 09:00:00 100 mL via INTRAVENOUS

## 2021-01-18 MED ORDER — NITROGLYCERIN 0.4 MG SL SUBL
SUBLINGUAL_TABLET | SUBLINGUAL | Status: AC
  Administered 2021-01-18: 08:00:00 0.8 mg via SUBLINGUAL
  Filled 2021-01-18: qty 2

## 2021-01-18 MED ORDER — NITROGLYCERIN 0.4 MG SL SUBL: 0.8000 mg | SUBLINGUAL_TABLET | Freq: Once | SUBLINGUAL | Status: AC

## 2021-01-25 ENCOUNTER — Encounter (HOSPITAL_BASED_OUTPATIENT_CLINIC_OR_DEPARTMENT_OTHER): Payer: Self-pay

## 2021-02-13 ENCOUNTER — Other Ambulatory Visit: Payer: Self-pay

## 2021-02-13 ENCOUNTER — Ambulatory Visit: Payer: Managed Care, Other (non HMO) | Admitting: Pulmonary Disease

## 2021-02-13 ENCOUNTER — Encounter: Payer: Self-pay | Admitting: Pulmonary Disease

## 2021-02-13 ENCOUNTER — Ambulatory Visit (INDEPENDENT_AMBULATORY_CARE_PROVIDER_SITE_OTHER): Payer: Managed Care, Other (non HMO) | Admitting: Pulmonary Disease

## 2021-02-13 VITALS — BP 140/80 | HR 69 | Temp 98.1°F | Ht 65.5 in | Wt 347.8 lb

## 2021-02-13 DIAGNOSIS — G4733 Obstructive sleep apnea (adult) (pediatric): Secondary | ICD-10-CM | POA: Diagnosis not present

## 2021-02-13 DIAGNOSIS — R0602 Shortness of breath: Secondary | ICD-10-CM

## 2021-02-13 LAB — PULMONARY FUNCTION TEST
DL/VA % pred: 105 %
DL/VA: 4.49 ml/min/mmHg/L
DLCO cor % pred: 94 %
DLCO cor: 20.95 ml/min/mmHg
DLCO unc % pred: 94 %
DLCO unc: 20.95 ml/min/mmHg
FEF 25-75 Post: 4.48 L/sec
FEF 25-75 Pre: 4.17 L/sec
FEF2575-%Change-Post: 7 %
FEF2575-%Pred-Post: 175 %
FEF2575-%Pred-Pre: 162 %
FEV1-%Change-Post: -2 %
FEV1-%Pred-Post: 118 %
FEV1-%Pred-Pre: 121 %
FEV1-Post: 2.92 L
FEV1-Pre: 2.98 L
FEV1FVC-%Change-Post: 3 %
FEV1FVC-%Pred-Pre: 109 %
FEV6-%Change-Post: -5 %
FEV6-%Pred-Post: 106 %
FEV6-%Pred-Pre: 111 %
FEV6-Post: 3.18 L
FEV6-Pre: 3.34 L
FEV6FVC-%Pred-Post: 102 %
FEV6FVC-%Pred-Pre: 102 %
FVC-%Change-Post: -5 %
FVC-%Pred-Post: 103 %
FVC-%Pred-Pre: 109 %
FVC-Post: 3.18 L
FVC-Pre: 3.34 L
Post FEV1/FVC ratio: 92 %
Post FEV6/FVC ratio: 100 %
Pre FEV1/FVC ratio: 89 %
Pre FEV6/FVC Ratio: 100 %
RV % pred: 60 %
RV: 1.12 L
TLC % pred: 86 %
TLC: 4.56 L

## 2021-02-13 NOTE — Progress Notes (Signed)
PFT done today. 

## 2021-02-13 NOTE — Addendum Note (Signed)
Addended by: Jacquiline Doe on: 02/13/2021 12:24 PM   Modules accepted: Orders

## 2021-02-13 NOTE — Progress Notes (Signed)
Julia Booth    EI:[id number]    12-08-1970  Primary Care Physician:Collins, Hinton Dyer, DO  Referring Physician: Janie Morning, [place],  [place] [address]  Chief complaint:  Consult for dyspnea  HPI: 51 year old with history of hypertension, hyperlipidemia, allergies, sleep apnea, lupus Here to establish care Complains of chronic dyspnea on exertion for the past 8 months.  She gets symptomatic when laying down.  She also has dyspnea on exertion with chest tightness.  Occasional wheezing  She had an echocardiogram done which showed normal LV function and has been referred to cardiology History notable for lupus diagnosed in 1998.  She was initially followed by Dr.Semble at [facility] and then followed with a rheumatologist at [facility].  She has reestablished care with Dr. Dossie Der at [facility] recently Lupus was initially treated with steroids in 2019 and has been maintained on Plaquenil.  Symptoms are mostly joint pain and fatigue  She has history of sleep apnea and is on AutoSet CPAP 5-20.  She had a sleep study done many years ago in [place] and does not have a sleep doctor here  She had COVID-19 in March 2022.  She was evaluated at urgent care and did not require hospitalization.  She was put on steroids for 1 week at that time  I have reviewed her primary care, urgent care visit and other records  Pets: 2 turtles.  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  No feather pillows or comforter Smoking history: Smokes 1 to 2 cigarettes/month for stress relief Travel history: Originally from [place].  She moved to [facility] for 6 years until 2021 Relevant family history: Sister had asthma.  No other significant family history of lung disease  Interim history: Received note from Dr. Dossie Der, rheumatology dated 12/12/2020 She has a  diagnosis of lupus, connective tissue disease overlap syndrome Checking lupus markers Trial of prednisone starting at 30 mg with taper She is also being evaluated for Benlysta.  States that breathing is doing well with no issues.  She is here for review of CT and PFTs  Outpatient Encounter Medications as of 02/13/2021  Medication Sig   carvedilol (COREG) 6.25 MG tablet Take 6.25 mg by mouth 2 (two) times daily.   furosemide (LASIX) 20 MG tablet Take 1 tablet (20 mg total) by mouth daily.   hydroxychloroquine (PLAQUENIL) 200 MG tablet Take 200 mg by mouth 2 (two) times daily.   metoprolol tartrate (LOPRESSOR) 100 MG tablet Take 1 tablet 2 hours prior to CT   spironolactone (ALDACTONE) 50 MG tablet Take 50 mg by mouth daily.   traMADol HCl 100 MG TABS tramadol 100 mg tablet   No facility-administered encounter medications on file as of 02/13/2021.    Physical Exam: Gen:      No acute distress HEENT:  EOMI, sclera anicteric Neck:     No masses; no thyromegaly Lungs:    Clear to auscultation bilaterally; normal respiratory effort CV:         Regular rate and rhythm; no murmurs Abd:      + bowel sounds; soft, non-tender; no palpable masses, no distension Ext:    No edema; adequate peripheral perfusion Skin:      Warm and dry; no rash Neuro: alert and oriented x 3 Psych: normal mood and affect   Data Reviewed: Imaging: High-resolution CT 01/02/2021-minimal  subpleural densities in the right upper lobe and lower lobe.  2 mm subpleural lymph node along minor fissure.  I have reviewed the images personally.  PFTs: 02/13/2021 FVC 3.18 [103%], FEV1 2.92 [118%], F/F 92, TLC 4.56 [86%], DLCO 20.95 [94%] Normal test  Labs: CBC 12/12/2020-WBC 3.6, eos 1.4%, absolute eosinophil count 50 IgE 12/12/20-159  Cardiac: Echocardiogram 05/19/2020 Outside report Normal LV size, normal diastolic filling and systolic function.  Calculated EF 75% Normal RV size.  LA and RA cavity is mildly  dilated  Assessment:  Consult for dyspnea Evaluation so far has been okay with high-res CT showing very minimal nonspecific changes that may be from prior COVID infection.  There is no clear evidence of interstitial lung disease.  She has normal PFTs No eosinophilia or elevated IgE  Suspect weight loss is playing a role in presentation Encouraged weight loss with diet and exercise as body habitus is playing a role in presentation  Lupus Followed with Dr. Conception Oms.  She is considering starting Benlysta  OSA Maintained on CPAP She would like to transfer OSA care to [place] We ordered home sleep study at last visit but has not been done yet  Plan/Recommendations: Weight loss Home sleep study  Marshell Garfinkel MD [facility] 02/13/2021, 10:59 AM  CC: Janie Morning, DO

## 2021-02-13 NOTE — Patient Instructions (Signed)
I am glad you are doing well with your breathing You are CT scan and lung function test shows minimal changes which are not concerning at this point.  We will continue to monitor going forward  Order repeat high-res CT with supine and prone images in September 2023  We will check on the home sleep study that had been ordered.  Follow-up in 6 months

## 2021-04-17 ENCOUNTER — Ambulatory Visit: Payer: Managed Care, Other (non HMO)

## 2021-04-17 ENCOUNTER — Other Ambulatory Visit: Payer: Self-pay

## 2021-04-17 DIAGNOSIS — G4733 Obstructive sleep apnea (adult) (pediatric): Secondary | ICD-10-CM | POA: Diagnosis not present

## 2021-04-20 DIAGNOSIS — G4733 Obstructive sleep apnea (adult) (pediatric): Secondary | ICD-10-CM | POA: Diagnosis not present

## 2021-05-08 ENCOUNTER — Encounter (HOSPITAL_BASED_OUTPATIENT_CLINIC_OR_DEPARTMENT_OTHER): Payer: Self-pay | Admitting: Cardiovascular Disease

## 2021-05-08 ENCOUNTER — Ambulatory Visit (HOSPITAL_BASED_OUTPATIENT_CLINIC_OR_DEPARTMENT_OTHER): Payer: Managed Care, Other (non HMO) | Admitting: Cardiovascular Disease

## 2021-05-08 VITALS — BP 136/84 | HR 76 | Ht 60.5 in | Wt 339.2 lb

## 2021-05-08 DIAGNOSIS — I700 Atherosclerosis of aorta: Secondary | ICD-10-CM | POA: Diagnosis not present

## 2021-05-08 DIAGNOSIS — I10 Essential (primary) hypertension: Secondary | ICD-10-CM | POA: Diagnosis not present

## 2021-05-08 DIAGNOSIS — Z5181 Encounter for therapeutic drug level monitoring: Secondary | ICD-10-CM

## 2021-05-08 DIAGNOSIS — R739 Hyperglycemia, unspecified: Secondary | ICD-10-CM

## 2021-05-08 DIAGNOSIS — Z6841 Body Mass Index (BMI) 40.0 and over, adult: Secondary | ICD-10-CM

## 2021-05-08 HISTORY — DX: Atherosclerosis of aorta: I70.0

## 2021-05-08 NOTE — Assessment & Plan Note (Signed)
Noted coronary calcium score.  LDL goal should be less than 70.  Check fasting lipids and a CMP today. ?

## 2021-05-08 NOTE — Progress Notes (Signed)
?Cardiology Office Note:   ? ?Date:  05/08/2021  ? ?ID:  [name], DOB 29-Jun-1970, MRN [id number] ? ?PCP:  Irena Reichmann, DO ?  ?[facility] Providers ?Cardiologist:  None    ? ?Referring MD: Irena Reichmann, DO  ? ?No chief complaint on file. ? ? ?History of Present Illness:   ? ?Julia Booth is a 51 y.o. female with a hx of SLE, aortic atherosclerosis, hypertension, hyperlipidemia, morbid obesity, and OSA, here for follow-up. She was initially seen 01/2021 for the evaluation of shortness of breath and edema. She saw Dr. Isaiah Serge 12/2020 and complained of dyspnea for the preceding 8 months. She also reported exertional dyspnea, chest tightness, and occasional wheezing. She reportedly had an Echo that revealed normal systolic function. She was dx with Lupus in 1998 and was seen by Dr. Janyth Contes at [facility]. She had COVID-19 on 04/2020. He thought her dyspnea was multifactorial. He was concerned for reactive airway disease, Lupus ILD, and post-COVID syndrome. She had a chest CT 12/2020 which showed early/mild ILD and was indeterminate for UIT. She had PFT's and a sleep test was ordered but has not been performed. ? ?At her last visit, she complained of worsening shortness of breath and LE edema. Furosemide 20 mg was started and coronary CTA 01/2021 showed a calcium score of 0 and aortic atherosclerosis. ? ?Today, she has been doing alright. However, she reports headaches which she associates with anxiety. The headaches started 2 months ago. When she stands up, she becomes dizzy for a few minutes. She is still able to move with the dizziness. The dizziness occurs every few days. She also reports intermittent LE edema. The swelling is improved with Lasix. She has not been taking her spironolactone because she thought it was also a fluid pill. Her blood pressure at home has been elevated. She is managing her pain with lupus. For diet, she was been changing her eating habits. For example, she has been reducing her  fried foods, carbohydrates, and sodium intake and working on her portions. For exercise, she walks at least twice a week and uses the recumbent bike occasionally. Her L knee pain limits her ability to exercise. As the weather gets warmer, she hopes to start water aerobics. She denies any palpitations, chest pain, shortness of breath, syncope, orthopnea, or PND. ? ?Past Medical History:  ?Diagnosis Date  ? Aortic atherosclerosis (HCC) 05/08/2021  ? Essential hypertension 01/05/2021  ? Exertional dyspnea 01/05/2021  ? OSA (obstructive sleep apnea) 01/05/2021  ? Pulmonary hypertension, unspecified (HCC) 01/05/2021  ? ? ?History reviewed. No pertinent surgical history. ? ?Current Medications: ?Current Meds  ?Medication Sig  ? carvedilol (COREG) 6.25 MG tablet Take 6.25 mg by mouth 2 (two) times daily.  ? furosemide (LASIX) 20 MG tablet Take 1 tablet (20 mg total) by mouth daily.  ? hydroxychloroquine (PLAQUENIL) 200 MG tablet Take 200 mg by mouth 2 (two) times daily.  ? traMADol HCl 100 MG TABS tramadol 100 mg tablet  ?  ? ?Allergies:   Penicillins and Amoxicillin-pot clavulanate  ? ?Social History  ? ?Socioeconomic History  ? Marital status: Single  ?  Spouse name: Not on file  ? Number of children: Not on file  ? Years of education: Not on file  ? Highest education level: Not on file  ?Occupational History  ? Not on file  ?Tobacco Use  ? Smoking status: Former  ?  Types: Cigarettes  ? Smokeless tobacco: Never  ? Tobacco comments:  ?  Smokes with stress- 1 time a month average  ?Substance and Sexual Activity  ? Alcohol use: Not on file  ? Drug use: Not on file  ? Sexual activity: Not on file  ?Other Topics Concern  ? Not on file  ?Social History Narrative  ? Not on file  ? ?Social Determinants of Health  ? ?Financial Resource Strain: Low Risk   ? Difficulty of Paying Living Expenses: Not hard at all  ?Food Insecurity: No Food Insecurity  ? Worried About Programme researcher, broadcasting/film/video in the Last Year: Never true  ? Ran Out of Food in  the Last Year: Never true  ?Transportation Needs: No Transportation Needs  ? Lack of Transportation (Medical): No  ? Lack of Transportation (Non-Medical): No  ?Physical Activity: Insufficiently Active  ? Days of Exercise per Week: 3 days  ? Minutes of Exercise per Session: 30 min  ?Stress: Not on file  ?Social Connections: Not on file  ?  ? ?Family History: ?The patient's family history includes Breast cancer in her maternal grandmother; Diabetes in her father, maternal aunt, maternal grandmother, mother, and sister; Heart attack in her maternal aunt and maternal grandmother; Hypertension in her father, maternal aunt, maternal grandmother, mother, and sister; Stroke in her father. ? ?ROS:   ?Please see the history of present illness.    ?(+) Headaches ?(+) Anxiety ?(+) Positional dizziness ?(+) Myalgias ?(+) L knee pain  ?(+) Bilateral LE edema ?All other systems reviewed and are negative. ? ?EKGs/Labs/Other Studies Reviewed:   ? ?The following studies were reviewed today: ?CT Coronary Morph 01/18/21 ?IMPRESSION: ?1. No evidence of CAD, CADRADS = 0. ?  ?2. Coronary calcium score of 0. This was 0 percentile for age and sex matched control. ?  ?3. Normal coronary origin with right dominance. ?  ?4. Aortic atherosclersosis. ?  ?5. Consider non-coronary causes of dyspnea. ? ?Echo 05/19/20: ?LVEF 76%.  Normal diastolic function.  LA mildly dilated. Normal RV function.  RVSP 44.6 mmHg.  Trivial MR, trivial AR. ? ?CT Chest 01/02/2021: ?FINDINGS: ?Cardiovascular: Atherosclerotic calcification of the aorta and ?aortic valve. Heart size normal. No pericardial effusion. ?  ?Mediastinum/Nodes: No pathologically enlarged mediastinal lymph nodes. Hilar regions are difficult to definitively evaluate without IV contrast. Subpectoral and axillary lymph nodes are subcentimeter in size. Esophagus is grossly unremarkable. ?  ?Lungs/Pleura: Minimal subpleural reticular densities in right upper lobe. Minimal ground-glass and questionable  subpleural reticular densities in the inferior right lower lobe and to a lesser extent, inferior left lower lobe. Probable 2 mm smudgy subpleural lymph node along the minor fissure (8/68). No pleural fluid. Airway is unremarkable. There is air trapping. ?  ?Upper Abdomen: Visualized portions of the liver, gallbladder, ?adrenal glands, kidneys, spleen, pancreas, stomach and bowel are ?grossly unremarkable. No upper abdominal adenopathy. ?  ?Musculoskeletal: Probable hemangioma in the T10 vertebral body. No ?worrisome lytic or sclerotic lesions. ?  ?IMPRESSION: ?1. Pulmonary parenchymal findings, as described above, are ?relatively nonspecific. Early/mild interstitial lung disease such as ?nonspecific interstitial pneumonitis cannot be excluded. Future ?evaluation with prone imaging would be helpful. Findings are ?indeterminate for UIP per consensus guidelines: Diagnosis of ?Idiopathic Pulmonary Fibrosis: An Official ATS/ERS/JRS/ALAT Clinical ?Practice Guideline. Ledora Bottcher Crit Care Med Vol 198, Iss 5, ?[id number], Oct 06 2016. ?2. 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 ?3.  Aortic atherosclerosis (ICD10-I70.0). ? ?EKG:   EKG was not ordered today ?01/05/2021: Sinus rhythm. Rate 73 bpm. RSR' in lead V1. ? ?Recent Labs: ?12/12/2020: Hemoglobin 12.7;  Platelets 279.0 ?01/18/2021: Creatinine, Ser 0.70  ? ?Recent Lipid Panel ?No results found for: CHOL, TRIG, HDL, CHOLHDL, VLDL, LDLCALC, LDLDIRECT ? ? ?Physical Exam:   ? ?Wt Readings from Last 3 Encounters:  ?02/13/21 (!) 347 lb 12.8 oz (157.8 kg)  ?01/05/21 (!) 349 lb (158.3 kg)  ?12/12/20 (!) 339 lb (153.8 kg)  ?  ? ?VS:  BP 136/84 (BP Location: Left Arm, Patient Position: Sitting, Cuff Size: Large)  , BMI There is no height or weight on file to calculate BMI. ?GENERAL:  Well appearing ?HEENT: Pupils equal round and reactive, fundi not visualized, oral mucosa unremarkable ?NECK:  No jugular venous distention, waveform within normal limits, carotid upstroke  brisk and symmetric, no bruits, no thyromegaly ?LUNGS:  Clear to auscultation bilaterally ?HEART:  RRR.  PMI not displaced or sustained,S1 and S2 within normal limits, no S3, no S4, no clicks, no rubs, no

## 2021-05-08 NOTE — Assessment & Plan Note (Addendum)
She has been struggling.  She has knee pain.  We will refer her to the healthy weight and wellness clinic.  We will also check a hemoglobin A1c.  Consider adding Ozempic based on these results. ?

## 2021-05-08 NOTE — Patient Instructions (Addendum)
Medication Instructions:  ?Your physician recommends that you continue on your current medications as directed. Please refer to the Current Medication list given to you today.  ? ?*If you need a refill on your cardiac medications before your next appointment, please call your pharmacy* ? ?Lab Work: ?LP/CMET/A1C TODAY  ? ?If you have labs (blood work) drawn today and your tests are completely normal, you will receive your results only by: ?MyChart Message (if you have MyChart) OR ?A paper copy in the mail ?If you have any lab test that is abnormal or we need to change your treatment, we will call you to review the results. ? ?Testing/Procedures: ?NONE ? ?Follow-Up: ?At [facility], you and your health needs are our priority.  As part of our continuing mission to provide you with exceptional heart care, we have created designated Provider Care Teams.  These Care Teams include your primary Cardiologist (physician) and Advanced Practice Providers (APPs -  Physician Assistants and Nurse Practitioners) who all work together to provide you with the care you need, when you need it. ? ?We recommend signing up for the patient portal called "MyChart".  Sign up information is provided on this After Visit Summary.  MyChart is used to connect with patients for Virtual Visits (Telemedicine).  Patients are able to view lab/test results, encounter notes, upcoming appointments, etc.  Non-urgent messages can be sent to your provider as well.   ?To learn more about what you can do with MyChart, go to [email or web address].   ? ?Your next appointment:   ?3 month(s) ? ?The format for your next appointment:   ?In Person ? ?Provider:   ?Chilton Si, MD{ ? ?You have been referred to  ? ?Where: [facility] ?Address: [address] [place] [address] ?Phone: [phone] ? ? ?

## 2021-05-08 NOTE — Assessment & Plan Note (Signed)
Blood pressure is slightly above goal today.  However she also reports orthostatic symptoms.  Therefore we will not add any medicines secondary to future refills for the last 3 to 5 days.  Fast when she started the Lasix.  Her edema has improved.  She will continue with Lasix 20 mg daily and carvedilol.  For now, we will hold off on adding any additional agents.  She is going to really work on diet and exercise.  We will also refer her to the healthy weight and wellness clinic.  Patient ?

## 2021-05-09 LAB — COMPREHENSIVE METABOLIC PANEL
ALT: 39 IU/L — ABNORMAL HIGH (ref 0–32)
AST: 28 IU/L (ref 0–40)
Albumin/Globulin Ratio: 1.3 (ref 1.2–2.2)
Albumin: 4.4 g/dL (ref 3.8–4.8)
Alkaline Phosphatase: 141 IU/L — ABNORMAL HIGH (ref 44–121)
BUN/Creatinine Ratio: 14 (ref 9–23)
BUN: 11 mg/dL (ref 6–24)
Bilirubin Total: 0.3 mg/dL (ref 0.0–1.2)
CO2: 25 mmol/L (ref 20–29)
Calcium: 10.2 mg/dL (ref 8.7–10.2)
Chloride: 101 mmol/L (ref 96–106)
Creatinine, Ser: 0.78 mg/dL (ref 0.57–1.00)
Globulin, Total: 3.5 g/dL (ref 1.5–4.5)
Glucose: 126 mg/dL — ABNORMAL HIGH (ref 70–99)
Potassium: 4.4 mmol/L (ref 3.5–5.2)
Sodium: 141 mmol/L (ref 134–144)
Total Protein: 7.9 g/dL (ref 6.0–8.5)
eGFR: 92 mL/min/{1.73_m2} (ref >59)

## 2021-05-09 LAB — LIPID PANEL
Chol/HDL Ratio: 2.8 ratio (ref 0.0–4.4)
Cholesterol, Total: 159 mg/dL (ref 100–199)
HDL: 57 mg/dL (ref >39)
LDL Chol Calc (NIH): 87 mg/dL (ref 0–99)
Triglycerides: 78 mg/dL (ref 0–149)
VLDL Cholesterol Cal: 15 mg/dL (ref 5–40)

## 2021-05-09 LAB — HEMOGLOBIN A1C
Est. average glucose Bld gHb Est-mCnc: 126 mg/dL
Hgb A1c MFr Bld: 6 % — ABNORMAL HIGH (ref 4.8–5.6)

## 2021-05-17 DIAGNOSIS — M5412 Radiculopathy, cervical region: Secondary | ICD-10-CM | POA: Insufficient documentation

## 2021-05-22 ENCOUNTER — Telehealth (HOSPITAL_BASED_OUTPATIENT_CLINIC_OR_DEPARTMENT_OTHER): Payer: Self-pay | Admitting: *Deleted

## 2021-05-22 DIAGNOSIS — I10 Essential (primary) hypertension: Secondary | ICD-10-CM

## 2021-05-22 DIAGNOSIS — Z5181 Encounter for therapeutic drug level monitoring: Secondary | ICD-10-CM

## 2021-05-22 DIAGNOSIS — E785 Hyperlipidemia, unspecified: Secondary | ICD-10-CM

## 2021-05-22 NOTE — Telephone Encounter (Signed)
-----   Message from Chilton Si, MD sent at 05/19/2021  9:31 AM EDT ----- ?Normal kidney function.  Liver function very mildly abnormal.   A1c is consistent with pre-diabetes.  LDL needs to be <70.  Recommend low dose rosuvastatin at 10mg .  Repeat lipids/CMP in 2 months.  Work on limiting carbs. ?

## 2021-05-22 NOTE — Telephone Encounter (Signed)
Left message to call back  

## 2021-05-23 NOTE — Telephone Encounter (Signed)
Mail box full - will try again  

## 2021-05-23 NOTE — Telephone Encounter (Signed)
Patient was returning call. Please advise ?

## 2021-05-26 MED ORDER — ROSUVASTATIN CALCIUM 10 MG PO TABS: 10.0000 mg | ORAL_TABLET | Freq: Every day | ORAL | 3 refills | Status: DC

## 2021-05-26 NOTE — Telephone Encounter (Signed)
Advised patient of lab results, placed lab orders, and sent Rx to pharmacy  ?

## 2021-06-20 ENCOUNTER — Encounter: Payer: Self-pay | Admitting: Pulmonary Disease

## 2021-06-20 DIAGNOSIS — G4733 Obstructive sleep apnea (adult) (pediatric): Secondary | ICD-10-CM

## 2021-06-21 NOTE — Telephone Encounter (Incomplete)
Received the following message from patient:  ? ?"Based on the sleep study what is next?  ?  ?Thanks" ? ? ?

## 2021-06-22 NOTE — Telephone Encounter (Signed)
Waiting for a replay from Dr Isaiah Serge about what is next for the patient after sleep study.

## 2021-06-28 NOTE — Telephone Encounter (Signed)
DME order placed and message sent to patient. Nothing further at this time.

## 2021-06-28 NOTE — Telephone Encounter (Signed)
I have reviewed her sleep study which does confirm sleep apnea Order Autoset CPAP 4-20cm. She would like to have a travel size machine since she is out of town often.

## 2021-06-28 NOTE — Telephone Encounter (Signed)
Based on the sleep study what is next?    Thanks

## 2021-07-18 DIAGNOSIS — M6281 Muscle weakness (generalized): Secondary | ICD-10-CM | POA: Insufficient documentation

## 2021-08-12 LAB — COMPREHENSIVE METABOLIC PANEL
ALT: 20 IU/L (ref 0–32)
AST: 24 IU/L (ref 0–40)
Albumin/Globulin Ratio: 1.5 (ref 1.2–2.2)
Albumin: 4.6 g/dL (ref 3.8–4.9)
Alkaline Phosphatase: 117 IU/L (ref 44–121)
BUN/Creatinine Ratio: 16 (ref 9–23)
BUN: 13 mg/dL (ref 6–24)
Bilirubin Total: 0.3 mg/dL (ref 0.0–1.2)
CO2: 25 mmol/L (ref 20–29)
Calcium: 9.7 mg/dL (ref 8.7–10.2)
Chloride: 101 mmol/L (ref 96–106)
Creatinine, Ser: 0.79 mg/dL (ref 0.57–1.00)
Globulin, Total: 3 g/dL (ref 1.5–4.5)
Glucose: 82 mg/dL (ref 70–99)
Potassium: 4.7 mmol/L (ref 3.5–5.2)
Sodium: 141 mmol/L (ref 134–144)
Total Protein: 7.6 g/dL (ref 6.0–8.5)
eGFR: 91 mL/min/{1.73_m2} (ref >59)

## 2021-08-12 LAB — LIPID PANEL
Chol/HDL Ratio: 2.1 ratio (ref 0.0–4.4)
Cholesterol, Total: 122 mg/dL (ref 100–199)
HDL: 59 mg/dL (ref >39)
LDL Chol Calc (NIH): 49 mg/dL (ref 0–99)
Triglycerides: 69 mg/dL (ref 0–149)
VLDL Cholesterol Cal: 14 mg/dL (ref 5–40)

## 2021-09-01 ENCOUNTER — Telehealth (HOSPITAL_BASED_OUTPATIENT_CLINIC_OR_DEPARTMENT_OTHER): Payer: Self-pay | Admitting: Cardiovascular Disease

## 2021-09-01 NOTE — Telephone Encounter (Signed)
Spoke with patient regarding new appointment date and time 10/10/21 at 2:20 pm (rescheduled from 09/05/21---Dr. Duke Salvia not in office).  She voiced her understanding

## 2021-09-01 NOTE — Telephone Encounter (Signed)
Left message for patient to call and reschedule 09/05/21 appointment with Dr. Duke Salvia (provider not in)---can use 7 Day Hold and 48 Hour Acute if necessary per Dr. Duke Salvia)

## 2021-09-05 ENCOUNTER — Ambulatory Visit (HOSPITAL_BASED_OUTPATIENT_CLINIC_OR_DEPARTMENT_OTHER): Payer: Managed Care, Other (non HMO) | Admitting: Cardiovascular Disease

## 2021-09-09 ENCOUNTER — Other Ambulatory Visit (HOSPITAL_BASED_OUTPATIENT_CLINIC_OR_DEPARTMENT_OTHER): Payer: Self-pay | Admitting: Cardiovascular Disease

## 2021-09-11 NOTE — Telephone Encounter (Signed)
Rx(s) sent to pharmacy electronically.  

## 2021-10-10 ENCOUNTER — Ambulatory Visit (HOSPITAL_BASED_OUTPATIENT_CLINIC_OR_DEPARTMENT_OTHER): Payer: Managed Care, Other (non HMO) | Admitting: Cardiovascular Disease

## 2021-10-10 ENCOUNTER — Encounter (HOSPITAL_BASED_OUTPATIENT_CLINIC_OR_DEPARTMENT_OTHER): Payer: Self-pay | Admitting: Cardiovascular Disease

## 2021-10-10 VITALS — BP 120/86 | HR 65 | Ht 60.5 in | Wt 353.0 lb

## 2021-10-10 DIAGNOSIS — Z6841 Body Mass Index (BMI) 40.0 and over, adult: Secondary | ICD-10-CM

## 2021-10-10 DIAGNOSIS — G4733 Obstructive sleep apnea (adult) (pediatric): Secondary | ICD-10-CM

## 2021-10-10 DIAGNOSIS — Z5181 Encounter for therapeutic drug level monitoring: Secondary | ICD-10-CM

## 2021-10-10 DIAGNOSIS — I272 Pulmonary hypertension, unspecified: Secondary | ICD-10-CM

## 2021-10-10 DIAGNOSIS — I10 Essential (primary) hypertension: Secondary | ICD-10-CM | POA: Diagnosis not present

## 2021-10-10 DIAGNOSIS — I700 Atherosclerosis of aorta: Secondary | ICD-10-CM | POA: Diagnosis not present

## 2021-10-10 MED ORDER — WEGOVY 0.25 MG/0.5ML ~~LOC~~ SOAJ: 0.2500 mg | SUBCUTANEOUS | 0 refills | Status: DC

## 2021-10-10 MED ORDER — CARVEDILOL 12.5 MG PO TABS: 12.5000 mg | ORAL_TABLET | Freq: Two times a day (BID) | ORAL | 3 refills | Status: DC

## 2021-10-10 MED ORDER — WEGOVY 0.5 MG/0.5ML ~~LOC~~ SOAJ: 0.5000 mg | SUBCUTANEOUS | 0 refills | Status: DC

## 2021-10-10 MED ORDER — WEGOVY 1 MG/0.5ML ~~LOC~~ SOAJ: 1.0000 mg | SUBCUTANEOUS | 5 refills | Status: DC

## 2021-10-10 MED ORDER — FUROSEMIDE 40 MG PO TABS: 40.0000 mg | ORAL_TABLET | Freq: Every day | ORAL | 3 refills | Status: DC

## 2021-10-10 NOTE — Assessment & Plan Note (Signed)
Noted on imaging.  LDL goal is less than 70.  She was started on rosuvastatin.  LDL was 57 on 08/2021.

## 2021-10-10 NOTE — Assessment & Plan Note (Signed)
BP is really well controlled.  She has consistently elevated diastolic blood pressures.  She also has frequent palpitations.  We will increase her carvedilol to 12.5 mg twice daily and she will keep checking it at home.

## 2021-10-10 NOTE — Patient Instructions (Signed)
Medication Instructions:  INCREASE YOUR CARVEDILOL TO 12.5 MG TWICE A DAY   INCREASE YOUR FUROSEMIDE TO 40 MG DAILY   START WEGOVY  INJECT 0.25 MG WEEKLY FOR 4 WEEKS  INCREASE TO 0.5 MG WEEKLY FOR 4 WEEKS,  INCREASE TO 1 MG WEEKLY FROM THEN ON   *If you need a refill on your cardiac medications before your next appointment, please call your pharmacy*  Lab Work: BMET IN 1 WEEK   If you have labs (blood work) drawn today and your tests are completely normal, you will receive your results only by: [employer] (if you have MyChart) OR A paper copy in the mail If you have any lab test that is abnormal or we need to change your treatment, we will call you to review the results.   Testing/Procedures: NONE  Follow-Up: At [facility], you and your health needs are our priority.  As part of our continuing mission to provide you with exceptional heart care, we have created designated Provider Care Teams.  These Care Teams include your primary Cardiologist (physician) and Advanced Practice Providers (APPs -  Physician Assistants and Nurse Practitioners) who all work together to provide you with the care you need, when you need it.  We recommend signing up for the patient portal called "MyChart".  Sign up information is provided on this After Visit Summary.  MyChart is used to connect with patients for Virtual Visits (Telemedicine).  Patients are able to view lab/test results, encounter notes, upcoming appointments, etc.  Non-urgent messages can be sent to your provider as well.   To learn more about what you can do with MyChart, go to [email or web address].    Your next appointment:   6 month(s)  The format for your next appointment:   In Person  Provider:   Chilton Si, MD

## 2021-10-10 NOTE — Assessment & Plan Note (Signed)
She continues to struggle with weight gain.  She is now on steroids for her lupus which is not helping.  We will refer her to the healthy weight and wellness clinic.  She was prescribed Wegovy by her PCP and it was approved but she has not been able to find in the pharmacy.  She asked that we try sending it to [employer].  We will start with 0.25 mg weekly for 4 weeks followed by 0.5 mg weekly for 4 weeks followed by 1 mg weekly.  We did discuss potential side effects of nausea and GI issues.  Recommended that she eat small meals and stop eating as soon as she starts to feel full.

## 2021-10-10 NOTE — Progress Notes (Signed)
Cardiology Office Note:    Date:  10/30/2021   ID:  Julia Booth, DOB [birth year], MRN [id number]  PCP:  Irena Reichmann, DO   [facility] Providers Cardiologist:  None     Referring MD: Irena Reichmann, DO   No chief complaint on file.   History of Present Illness:    Julia Booth is a 51 y.o. female with a hx of SLE, aortic atherosclerosis, hypertension, hyperlipidemia, morbid obesity, prediabetes, and OSA, here for follow-up. She was initially seen 01/2021 for the evaluation of shortness of breath and edema. She saw Dr. Isaiah Serge 12/2020 and complained of dyspnea for the preceding 8 months. She also reported exertional dyspnea, chest tightness, and occasional wheezing. She reportedly had an Echo that revealed normal systolic function. She was dx with Lupus in 1998 and was seen by Dr. Janyth Contes at [facility]. She had COVID-19 on 04/2020. He thought her dyspnea was multifactorial. He was concerned for reactive airway disease, Lupus ILD, and post-COVID syndrome. She had a chest CT 12/2020 which showed early/mild ILD and was indeterminate for UIT. She had PFT's and a sleep test was ordered but has not been performed.  Julia Booth complained of worsening shortness of breath and LE edema. Furosemide 20 mg was started and coronary CTA 01/2021 showed a calcium score of 0 and aortic atherosclerosis.  At her last visit she was referred to the healthy weight and wellness clinic.  Based on her labs she was started on a statin and was found to have sleep apnea.  CPAP was recommended.  Julia Booth, reports taking 5 mg of Prednisone for SLE.  She is experiencing shortness of breath, palpitations even at rest, and mild chest pain in the left side. She also mentions persistent fluid retention despite taking a diuretic.  Julia Booth states that she has palpitations approximately every other day. She recently had a sleep study and started using a CPAP machine, but has not noticed any improvement in her  palpitations. She is unsure if there is a correlation between her Carvedilol dose and palpitations but is willing to monitor this.  The patient reports limited exercise capacity and has started going to the pool and modifying her diet. However, she continues to gain weight. Her A1c was in the pre-diabetes range. She has been on backorder for Wegovy at [facility].  [name] describes her chest pain as being more prominent during periods of stress. She had a CT scan a year ago that showed normal coronary arteries.   Past Medical History:  Diagnosis Date   Aortic atherosclerosis (HCC) 05/08/2021   Essential hypertension 01/05/2021   Exertional dyspnea 01/05/2021   OSA (obstructive sleep apnea) 01/05/2021   Pulmonary hypertension, unspecified (HCC) 01/05/2021    History reviewed. No pertinent surgical history.  Current Medications: Current Meds  Medication Sig   hydroxychloroquine (PLAQUENIL) 200 MG tablet Take 200 mg by mouth 2 (two) times daily.   predniSONE (DELTASONE) 5 MG tablet Take 1.5 tablets by mouth daily.   rosuvastatin (CRESTOR) 10 MG tablet Take 1 tablet (10 mg total) by mouth daily.   Semaglutide-Weight Management (WEGOVY) 0.25 MG/0.5ML SOAJ Inject 0.25 mg into the skin once a week. FOR 4 WEEKS   Semaglutide-Weight Management (WEGOVY) 0.5 MG/0.5ML SOAJ Inject 0.5 mg into the skin once a week. FOR 4 WEEKS WHEN YOU FINISH THE 0.25 MG   Semaglutide-Weight Management (WEGOVY) 1 MG/0.5ML SOAJ Inject 1 mg into the skin once a week. WHEN YOU FINISH THE 0.5 MG   traMADol  HCl 100 MG TABS tramadol 100 mg tablet   [DISCONTINUED] carvedilol (COREG) 6.25 MG tablet Take 6.25 mg by mouth 2 (two) times daily.   [DISCONTINUED] furosemide (LASIX) 20 MG tablet TAKE 1 TABLET BY MOUTH EVERY DAY     Allergies:   Penicillins and Amoxicillin-pot clavulanate   Social History   Socioeconomic History   Marital status: Single    Spouse name: Not on file   Number of children: Not on file   Years of  education: Not on file   Highest education level: Not on file  Occupational History   Not on file  Tobacco Use   Smoking status: Former    Types: Cigarettes   Smokeless tobacco: Never   Tobacco comments:    Smokes with stress- 1 time a month average  Substance and Sexual Activity   Alcohol use: Not on file   Drug use: Not on file   Sexual activity: Not on file  Other Topics Concern   Not on file  Social History Narrative   Not on file   Social Determinants of Health   Financial Resource Strain: Low Risk  (01/05/2021)   Overall Financial Resource Strain (CARDIA)    Difficulty of Paying Living Expenses: Not hard at all  Food Insecurity: No Food Insecurity (01/05/2021)   Hunger Vital Sign    Worried About Running Out of Food in the Last Year: Never true    Ran Out of Food in the Last Year: Never true  Transportation Needs: No Transportation Needs (01/05/2021)   PRAPARE - Administrator, Civil Service (Medical): No    Lack of Transportation (Non-Medical): No  Physical Activity: Insufficiently Active (01/05/2021)   Exercise Vital Sign    Days of Exercise per Week: 3 days    Minutes of Exercise per Session: 30 min  Stress: Not on file  Social Connections: Not on file     Family History: The patient's family history includes Breast cancer in her maternal grandmother; Diabetes in her father, maternal aunt, maternal grandmother, mother, and sister; Heart attack in her maternal aunt and maternal grandmother; Hypertension in her father, maternal aunt, maternal grandmother, mother, and sister; Stroke in her father.  ROS:   Please see the history of present illness.    All other systems reviewed and are negative.  EKGs/Labs/Other Studies Reviewed:    The following studies were reviewed today: CT Coronary Morph 01/18/21 IMPRESSION: 1. No evidence of CAD, CADRADS = 0.   2. Coronary calcium score of 0. This was 0 percentile for age and sex matched control.   3. Normal  coronary origin with right dominance.   4. Aortic atherosclersosis.   5. Consider non-coronary causes of dyspnea.  Echo 05/19/20: LVEF 76%.  Normal diastolic function.  LA mildly dilated. Normal RV function.  RVSP 44.6 mmHg.  Trivial MR, trivial AR.  CT Chest 01/02/2021: FINDINGS: Cardiovascular: Atherosclerotic calcification of the aorta and aortic valve. Heart size normal. No pericardial effusion.   Mediastinum/Nodes: No pathologically enlarged mediastinal lymph nodes. Hilar regions are difficult to definitively evaluate without IV contrast. Subpectoral and axillary lymph nodes are subcentimeter in size. Esophagus is grossly unremarkable.   Lungs/Pleura: Minimal subpleural reticular densities in right upper lobe. Minimal ground-glass and questionable subpleural reticular densities in the inferior right lower lobe and to a lesser extent, inferior left lower lobe. Probable 2 mm smudgy subpleural lymph node along the minor fissure (8/68). No pleural fluid. Airway is unremarkable. There is air trapping.  Upper Abdomen: Visualized portions of the liver, gallbladder, adrenal glands, kidneys, spleen, pancreas, stomach and bowel are grossly unremarkable. No upper abdominal adenopathy.   Musculoskeletal: Probable hemangioma in the T10 vertebral body. No worrisome lytic or sclerotic lesions.   IMPRESSION: 1. Pulmonary parenchymal findings, as described above, are relatively nonspecific. Early/mild interstitial lung disease such as nonspecific interstitial pneumonitis cannot be excluded. Future evaluation with prone imaging would be helpful. Findings are indeterminate for UIP per consensus guidelines: Diagnosis of Idiopathic Pulmonary Fibrosis: An Official ATS/ERS/JRS/ALAT Clinical Practice Guideline. Am Rosezetta Schlatter Crit Care Med Vol 198, Iss 5, [phone], Oct 06 2016. 2. 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 3.  Aortic atherosclerosis (ICD10-I70.0).  EKG:    10/10/21: Sinus  rhythm.  Rate 65 bpm.  Incomplete RBBB. 01/05/2021: Sinus rhythm. Rate 73 bpm. RSR' in lead V1.  Recent Labs: 12/12/2020: Hemoglobin 12.7; Platelets 279.0 08/11/2021: ALT 20; BUN 13; Creatinine, Ser 0.79; Potassium 4.7; Sodium 141   Recent Lipid Panel    Component Value Date/Time   CHOL 122 08/11/2021 1640   TRIG 69 08/11/2021 1640   HDL 59 08/11/2021 1640   CHOLHDL 2.1 08/11/2021 1640   LDLCALC 49 08/11/2021 1640     Physical Exam:    Wt Readings from Last 3 Encounters:  10/10/21 (!) 353 lb (160.1 kg)  05/08/21 (!) 339 lb 3.2 oz (153.9 kg)  02/13/21 (!) 347 lb 12.8 oz (157.8 kg)     VS:  BP 120/86   Pulse 65   Ht 5' 0.5" (1.537 m)   Wt (!) 353 lb (160.1 kg)   BMI 67.81 kg/m  , BMI Body mass index is 67.81 kg/m. GENERAL:  Well appearing HEENT: Pupils equal round and reactive, fundi not visualized, oral mucosa unremarkable NECK:  No jugular venous distention, waveform within normal limits, carotid upstroke brisk and symmetric, no bruits, no thyromegaly LUNGS:  Clear to auscultation bilaterally HEART:  RRR.  PMI not displaced or sustained,S1 and S2 within normal limits, no S3, no S4, no clicks, no rubs, no murmurs ABD:  Flat, positive bowel sounds normal in frequency in pitch, no bruits, no rebound, no guarding, no midline pulsatile mass, no hepatomegaly, no splenomegaly EXT:  2 plus pulses throughout, no edema, no cyanosis no clubbing SKIN:  No rashes no nodules NEURO:  Cranial nerves II through XII grossly intact, motor grossly intact throughout PSYCH:  Cognitively intact, oriented to person place and time   ASSESSMENT:    1. OSA (obstructive sleep apnea)   2. Morbid obesity with BMI of 40.0-44.9, adult (HCC)   3. Essential hypertension   4. Aortic atherosclerosis (HCC)   5. Pulmonary hypertension, unspecified (HCC)   6. Encounter for therapeutic drug monitoring      PLAN:    Essential hypertension BP is really well controlled.  She has consistently elevated  diastolic blood pressures.  She also has frequent palpitations.  We will increase her carvedilol to 12.5 mg twice daily and she will keep checking it at home.  Aortic atherosclerosis (HCC) Noted on imaging.  LDL goal is less than 70.  She was started on rosuvastatin.  LDL was 57 on 08/2021.  OSA (obstructive sleep apnea) Recently started on CPAP and she is doing well.  Morbid obesity with BMI of 40.0-44.9, adult (HCC) She continues to struggle with weight gain.  She is now on steroids for her lupus which is not helping.  We will refer her to the healthy weight and wellness clinic.  She was  prescribed Wegovy by her PCP and it was approved but she has not been able to find in the pharmacy.  She asked that we try sending it to [facility].  We will start with 0.25 mg weekly for 4 weeks followed by 0.5 mg weekly for 4 weeks followed by 1 mg weekly.  We did discuss potential side effects of nausea and GI issues.  Recommended that she eat small meals and stop eating as soon as she starts to feel full.   Disposition: FU with Jacobe Study C. Duke Salvia, MD, [facility] in 6 months.  Medication Adjustments/Labs and Tests Ordered: Current medicines are reviewed at length with the patient today.  Concerns regarding medicines are outlined above.   Orders Placed This Encounter  Procedures   Basic metabolic panel   Ambulatory referral to Family Practice   EKG 12-Lead    Meds ordered this encounter  Medications   Semaglutide-Weight Management (WEGOVY) 0.25 MG/0.5ML SOAJ    Sig: Inject 0.25 mg into the skin once a week. FOR 4 WEEKS    Dispense:  2 mL    Refill:  0   Semaglutide-Weight Management (WEGOVY) 1 MG/0.5ML SOAJ    Sig: Inject 1 mg into the skin once a week. WHEN YOU FINISH THE 0.5 MG    Dispense:  2 mL    Refill:  5   Semaglutide-Weight Management (WEGOVY) 0.5 MG/0.5ML SOAJ    Sig: Inject 0.5 mg into the skin once a week. FOR 4 WEEKS WHEN YOU FINISH THE 0.25 MG    Dispense:  2 mL    Refill:  0    carvedilol (COREG) 12.5 MG tablet    Sig: Take 1 tablet (12.5 mg total) by mouth 2 (two) times daily.    Dispense:  180 tablet    Refill:  3    D/C 6.25 mg Rx, increased dose   furosemide (LASIX) 40 MG tablet    Sig: Take 1 tablet (40 mg total) by mouth daily.    Dispense:  90 tablet    Refill:  3    D/C 20 mg rx, new dose    Patient Instructions  Medication Instructions:  INCREASE YOUR CARVEDILOL TO 12.5 MG TWICE A DAY   INCREASE YOUR FUROSEMIDE TO 40 MG DAILY   START WEGOVY  INJECT 0.25 MG WEEKLY FOR 4 WEEKS  INCREASE TO 0.5 MG WEEKLY FOR 4 WEEKS,  INCREASE TO 1 MG WEEKLY FROM THEN ON   *If you need a refill on your cardiac medications before your next appointment, please call your pharmacy*  Lab Work: BMET IN 1 WEEK   If you have labs (blood work) drawn today and your tests are completely normal, you will receive your results only by: [employer] (if you have MyChart) OR A paper copy in the mail If you have any lab test that is abnormal or we need to change your treatment, we will call you to review the results.   Testing/Procedures: NONE  Follow-Up: At [facility], you and your health needs are our priority.  As part of our continuing mission to provide you with exceptional heart care, we have created designated Provider Care Teams.  These Care Teams include your primary Cardiologist (physician) and Advanced Practice Providers (APPs -  Physician Assistants and Nurse Practitioners) who all work together to provide you with the care you need, when you need it.  We recommend signing up for the patient portal called "MyChart".  Sign up information is provided on this After  Visit Summary.  MyChart is used to connect with patients for Virtual Visits (Telemedicine).  Patients are able to view lab/test results, encounter notes, upcoming appointments, etc.  Non-urgent messages can be sent to your provider as well.   To learn more about what you can do with MyChart,  go to [email or web address].    Your next appointment:   6 month(s)  The format for your next appointment:   In Person  Provider:   Chilton Siiffany [place], MD           Signed, Chilton Siiffany [place], MD  10/30/2021 12:06 PM    [facility]

## 2021-10-10 NOTE — Assessment & Plan Note (Signed)
Recently started on CPAP and she is doing well.

## 2021-10-20 ENCOUNTER — Ambulatory Visit (HOSPITAL_BASED_OUTPATIENT_CLINIC_OR_DEPARTMENT_OTHER): Payer: Managed Care, Other (non HMO)

## 2021-10-25 ENCOUNTER — Other Ambulatory Visit (HOSPITAL_BASED_OUTPATIENT_CLINIC_OR_DEPARTMENT_OTHER): Payer: Managed Care, Other (non HMO)

## 2021-10-26 ENCOUNTER — Ambulatory Visit (HOSPITAL_BASED_OUTPATIENT_CLINIC_OR_DEPARTMENT_OTHER): Payer: Managed Care, Other (non HMO) | Attending: Pulmonary Disease

## 2021-10-30 ENCOUNTER — Encounter (HOSPITAL_BASED_OUTPATIENT_CLINIC_OR_DEPARTMENT_OTHER): Payer: Self-pay | Admitting: Cardiovascular Disease

## 2021-11-01 ENCOUNTER — Ambulatory Visit (INDEPENDENT_AMBULATORY_CARE_PROVIDER_SITE_OTHER): Payer: Self-pay | Admitting: Family Medicine

## 2021-11-01 ENCOUNTER — Encounter (INDEPENDENT_AMBULATORY_CARE_PROVIDER_SITE_OTHER): Payer: Self-pay | Admitting: Family Medicine

## 2021-11-01 VITALS — BP 87/65 | HR 77 | Temp 98.0°F | Ht 65.0 in | Wt 355.0 lb

## 2021-11-01 DIAGNOSIS — Z6841 Body Mass Index (BMI) 40.0 and over, adult: Secondary | ICD-10-CM

## 2021-11-01 DIAGNOSIS — R7303 Prediabetes: Secondary | ICD-10-CM

## 2021-11-01 DIAGNOSIS — Z0289 Encounter for other administrative examinations: Secondary | ICD-10-CM

## 2021-11-01 NOTE — Progress Notes (Signed)
  Office: [phone]  /  Fax: [phone]  Initial Visit  Julia Booth was seen in clinic today to evaluate for obesity. She is interested in losing weight to improve overall health and reduce the risk of weight related complications.   She presents today to review program treatment options, initial physical assessment, and evaluation.      Past medical history includes:   Past Medical History:  Diagnosis Date   Aortic atherosclerosis ([place]) 05/08/2021   Essential hypertension 01/05/2021   Exertional dyspnea 01/05/2021   OSA (obstructive sleep apnea) 01/05/2021   Pulmonary hypertension, unspecified ([place]) 01/05/2021     Objective:   BP (!) 87/65   Pulse 77   Temp 98 F (36.7 C)   Ht 5\' 5"  (1.651 m)   Wt (!) 355 lb (161 kg)   SpO2 99%   BMI 59.08 kg/m  She was weighed on the bioimpedance scale:  Body mass index is 59.08 kg/m.  General:  Alert, oriented and cooperative. Patient is in no acute distress.  Respiratory: Normal respiratory effort, no problems with respiration noted  Extremities: Normal range of motion.    Mental Status: Normal mood and affect. Normal behavior. Normal judgment and thought content.   Assessment and Plan:  1. Prediabetes - EKG 12-Lead; Future  2. Class 3 severe obesity with serious comorbidity and body mass index (BMI) of 50.0 to 59.9 in adult, unspecified obesity type ([place])      Obesity Treatment Plan:  She will work on garnering support from family and friends to begin weight loss journey. Work on eliminating or reducing the presence of highly processed, calorie dense foods in the home. Complete provided nutritional and psychosocial assessment questionnaire.    Julia Booth will follow up in the next 1-2 weeks to review the above steps and continue evaluation and treatment.  Obesity Education Performed Today:  She was weighed on the bioimpedance scale and results were discussed and documented in the synopsis.  We discussed  obesity as a disease and the importance of a more detailed evaluation of all the factors contributing to the disease.  We discussed the importance of long term lifestyle changes which include nutrition, exercise and behavioral modifications as well as the importance of customizing this to her specific health and social needs.  We discussed the benefits of reaching a healthier weight to alleviate the symptoms of existing conditions and reduce the risks of the biomechanical, metabolic and psychological effects of obesity.  We discussed the goals of this program is to improve her overall health and not simply achieve a specific BMI.  Frequent visits are very important to patient success. I plan to see her every 2 weeks for the first 3 months and then evaluate the visit frequency after that time. I explained obesity is a life-long chronic disease and long term treatments would be required. Medications to help her follow his eating plan may be offered as appropriate but are not required. All medication decisions will be made together after the initial workup is done and benefits and side effects are discussed in depth.  The clinic rules were reviewed including the late policy, cancellation policy, no show and program fees.  Julia Booth appears to be in the action stage of change and states they are ready to start intensive lifestyle modifications and behavioral modifications.  45 minutes was spent today on this visit including the above counseling, pre-visit chart review, and post-visit documentation.  Dennard Nip, MD

## 2021-11-22 ENCOUNTER — Ambulatory Visit: Admission: RE | Admit: 2021-11-22 | Payer: Managed Care, Other (non HMO) | Source: Ambulatory Visit

## 2021-11-22 ENCOUNTER — Ambulatory Visit (INDEPENDENT_AMBULATORY_CARE_PROVIDER_SITE_OTHER): Payer: Managed Care, Other (non HMO) | Admitting: Family Medicine

## 2021-11-22 ENCOUNTER — Encounter (INDEPENDENT_AMBULATORY_CARE_PROVIDER_SITE_OTHER): Payer: Self-pay | Admitting: Family Medicine

## 2021-11-22 VITALS — BP 116/77 | HR 51 | Temp 98.2°F | Ht 65.0 in | Wt 345.0 lb

## 2021-11-22 DIAGNOSIS — Z6841 Body Mass Index (BMI) 40.0 and over, adult: Secondary | ICD-10-CM

## 2021-11-22 DIAGNOSIS — E669 Obesity, unspecified: Secondary | ICD-10-CM | POA: Diagnosis not present

## 2021-11-22 DIAGNOSIS — R7303 Prediabetes: Secondary | ICD-10-CM | POA: Diagnosis not present

## 2021-11-22 DIAGNOSIS — R5383 Other fatigue: Secondary | ICD-10-CM | POA: Diagnosis not present

## 2021-11-22 DIAGNOSIS — R0602 Shortness of breath: Secondary | ICD-10-CM | POA: Diagnosis not present

## 2021-11-22 DIAGNOSIS — Z1331 Encounter for screening for depression: Secondary | ICD-10-CM

## 2021-11-22 DIAGNOSIS — I10 Essential (primary) hypertension: Secondary | ICD-10-CM

## 2021-11-22 DIAGNOSIS — R0609 Other forms of dyspnea: Secondary | ICD-10-CM | POA: Insufficient documentation

## 2021-11-23 LAB — INSULIN, RANDOM: INSULIN: 17.8 u[IU]/mL (ref 2.6–24.9)

## 2021-11-23 LAB — T3: T3, Total: 150 ng/dL (ref 71–180)

## 2021-11-23 LAB — CMP14+EGFR
ALT: 25 IU/L (ref 0–32)
AST: 21 IU/L (ref 0–40)
Albumin/Globulin Ratio: 1.2 (ref 1.2–2.2)
Albumin: 4.3 g/dL (ref 3.8–4.9)
Alkaline Phosphatase: 138 IU/L — ABNORMAL HIGH (ref 44–121)
BUN/Creatinine Ratio: 13 (ref 9–23)
BUN: 10 mg/dL (ref 6–24)
Bilirubin Total: 0.4 mg/dL (ref 0.0–1.2)
CO2: 27 mmol/L (ref 20–29)
Calcium: 9.7 mg/dL (ref 8.7–10.2)
Chloride: 99 mmol/L (ref 96–106)
Creatinine, Ser: 0.78 mg/dL (ref 0.57–1.00)
Globulin, Total: 3.6 g/dL (ref 1.5–4.5)
Glucose: 105 mg/dL — ABNORMAL HIGH (ref 70–99)
Potassium: 4 mmol/L (ref 3.5–5.2)
Sodium: 140 mmol/L (ref 134–144)
Total Protein: 7.9 g/dL (ref 6.0–8.5)
eGFR: 92 mL/min/{1.73_m2} (ref >59)

## 2021-11-23 LAB — CBC WITH DIFFERENTIAL/PLATELET
Basophils Absolute: 0 10*3/uL (ref 0.0–0.2)
Basos: 0 %
EOS (ABSOLUTE): 0 10*3/uL (ref 0.0–0.4)
Eos: 1 %
Hematocrit: 36.4 % (ref 34.0–46.6)
Hemoglobin: 12.4 g/dL (ref 11.1–15.9)
Immature Grans (Abs): 0 10*3/uL (ref 0.0–0.1)
Immature Granulocytes: 0 %
Lymphocytes Absolute: 1.8 10*3/uL (ref 0.7–3.1)
Lymphs: 54 %
MCH: 28.7 pg (ref 26.6–33.0)
MCHC: 34.1 g/dL (ref 31.5–35.7)
MCV: 84 fL (ref 79–97)
Monocytes Absolute: 0.3 10*3/uL (ref 0.1–0.9)
Monocytes: 9 %
Neutrophils Absolute: 1.2 10*3/uL — ABNORMAL LOW (ref 1.4–7.0)
Neutrophils: 36 %
Platelets: 245 10*3/uL (ref 150–450)
RBC: 4.32 x10E6/uL (ref 3.77–5.28)
RDW: 13.2 % (ref 11.7–15.4)
WBC: 3.3 10*3/uL — ABNORMAL LOW (ref 3.4–10.8)

## 2021-11-23 LAB — HEMOGLOBIN A1C
Est. average glucose Bld gHb Est-mCnc: 123 mg/dL
Hgb A1c MFr Bld: 5.9 % — ABNORMAL HIGH (ref 4.8–5.6)

## 2021-11-23 LAB — T4, FREE: Free T4: 1.37 ng/dL (ref 0.82–1.77)

## 2021-11-23 LAB — FOLATE: Folate: 7.2 ng/mL (ref >3.0)

## 2021-11-23 LAB — LIPID PANEL WITH LDL/HDL RATIO
Cholesterol, Total: 118 mg/dL (ref 100–199)
HDL: 62 mg/dL (ref >39)
LDL Chol Calc (NIH): 45 mg/dL (ref 0–99)
LDL/HDL Ratio: 0.7 ratio (ref 0.0–3.2)
Triglycerides: 47 mg/dL (ref 0–149)
VLDL Cholesterol Cal: 11 mg/dL (ref 5–40)

## 2021-11-23 LAB — TSH: TSH: 1.03 u[IU]/mL (ref 0.450–4.500)

## 2021-11-23 LAB — VITAMIN B12: Vitamin B-12: 604 pg/mL (ref 232–1245)

## 2021-11-23 LAB — VITAMIN D 25 HYDROXY (VIT D DEFICIENCY, FRACTURES): Vit D, 25-Hydroxy: 25 ng/mL — ABNORMAL LOW (ref 30.0–100.0)

## 2021-11-23 LAB — PHOSPHORUS: Phosphorus: 3.3 mg/dL (ref 3.0–4.3)

## 2021-11-23 LAB — MAGNESIUM: Magnesium: 1.9 mg/dL (ref 1.6–2.3)

## 2021-11-27 NOTE — Progress Notes (Signed)
Chief Complaint:   Julia Booth (MR# [id number]) is a 51 y.o. female who presents for evaluation and treatment of obesity and related comorbidities. Current BMI is Body mass index is 57.41 kg/m. Julia Booth has been struggling with her weight for many years and has been unsuccessful in either losing weight, maintaining weight loss, or reaching her healthy weight goal.  Julia Booth is ready to work on her weight loss. She needs to have her BMI below 40 to have knee surgery. She  was on Wegovy in the past, but she had to stop due to drug shortages.   Julia Booth's habits were reviewed today and are as follows: Her family eats meals together, she struggles with family and or coworkers weight loss sabotage, her desired weight loss is 65 lbs, she started gaining weight in the last 10 years, her heaviest weight ever was 356 pounds, she is a picky eater and doesn't like to eat healthier foods, she has significant food cravings issues, she snacks frequently in the evenings, she skips meals frequently, she is frequently drinking liquids with calories, she frequently makes poor food choices, and she struggles with emotional eating.  Depression Screen Julia Booth's Food and Mood (modified PHQ-9) score was 16.     11/22/2021    7:59 AM  Depression screen PHQ 2/9  Decreased Interest 3  Down, Depressed, Hopeless 1  PHQ - 2 Score 4  Altered sleeping 1  Tired, decreased energy 3  Change in appetite 2  Feeling bad or failure about yourself  0  Trouble concentrating 3  Moving slowly or fidgety/restless 3  Suicidal thoughts 0  PHQ-9 Score 16  Difficult doing work/chores Very difficult   Subjective:   1. Other fatigue Julia Booth admits to daytime somnolence and admits to waking up still tired. Patient has a history of symptoms of daytime fatigue, morning fatigue, and morning headache. Julia Booth generally gets 6 or 7 hours of sleep per night, and states that she has generally restful  sleep. Snoring is present. Apneic episodes are present. Epworth Sleepiness Score is 4.   2. SOBOE (shortness of breath on exertion) Julia Booth notes increasing shortness of breath with exercising and seems to be worsening over time with weight gain. She notes getting out of breath sooner with activity than she used to. This has not gotten worse recently. Julia Booth denies shortness of breath at rest or orthopnea.  3. Prediabetes Julia Booth's last A1c was elevated at 6.0. She is working on her diet and exercise. She notes polyphagia.   4. Essential hypertension Julia Booth's blood pressure is stable on her medications. She is working on weight loss.   Assessment/Plan:   1. Other fatigue Julia Booth does feel that her weight is causing her energy to be lower than it should be. Fatigue may be related to obesity, depression or many other causes. Labs will be ordered, and in the meanwhile, Julia Booth will focus on self care including making healthy food choices, increasing physical activity and focusing on stress reduction.  - CBC with Differential/Platelet - Vitamin B12 - TSH - T4, free - T3 - Folate - Magnesium - Phosphorus - VITAMIN D 25 Hydroxy (Vit-D Deficiency, Fractures)  2. SOBOE (shortness of breath on exertion) Julia Booth does feel that she gets out of breath more easily that she used to when she exercises. [name]'s shortness of breath appears to be obesity related and exercise induced. She has agreed to work on weight loss and gradually increase exercise to treat her exercise induced shortness of  breath. Will continue to monitor closely.  3. Prediabetes We will check labs today. Julia Booth will start her Category 3 plan, and will continue to work on weight loss, exercise, and decreasing simple carbohydrates to help decrease the risk of diabetes.   - CMP14+EGFR - Lipid Panel With LDL/HDL Ratio - Insulin, random - Hemoglobin A1c  4. Essential hypertension We will check labs today.  Julia Booth will start her Category 3 plan, with the goal to lose weight  to decrease her medications.   5. Depression screening Julia Booth had a positive depression screening. Depression is commonly associated with obesity and often results in emotional eating behaviors. We will monitor this closely and work on CBT to help improve the non-hunger eating patterns. Referral to Psychology may be required if no improvement is seen as she continues in our clinic.  6. Class 3 severe obesity with serious comorbidity and body mass index (BMI) of 50.0 to 59.9 in adult, unspecified obesity type Julia Booth) Julia Booth is currently in the action stage of change and her goal is to continue with weight loss efforts. I recommend Julia Booth begin the structured treatment plan as follows:  She has agreed to the Category 3 Plan.  Exercise goals: No exercise has been prescribed for now, while we concentrate on nutritional changes.   Behavioral modification strategies: decreasing eating out and no skipping meals.  She was informed of the importance of frequent follow-up visits to maximize her success with intensive lifestyle modifications for her multiple health conditions. She was informed we would discuss her lab results at her next visit unless there is a critical issue that needs to be addressed sooner. Julia Booth agreed to keep her next visit at the agreed upon time to discuss these results.  Objective:   Blood pressure 116/77, pulse (!) 51, temperature 98.2 F (36.8 C), height $RemoveBe'5\' 5"'TAbVwyteZ$  (1.651 m), weight (!) 345 lb (156.5 kg), SpO2 98 %. Body mass index is 57.41 kg/m.  EKG: Normal sinus rhythm, rate 65 BPM.  Indirect Calorimeter completed today shows a VO2 of 289 and a REE of 1987.  Her calculated basal metabolic rate is 3903 thus her basal metabolic rate is worse than expected.  General: Cooperative, alert, well developed, in no acute distress. HEENT: Conjunctivae and lids unremarkable. Cardiovascular: Regular  rhythm.  Lungs: Normal work of breathing. Neurologic: No focal deficits.   Lab Results  Component Value Date   CREATININE 0.78 11/22/2021   BUN 10 11/22/2021   NA 140 11/22/2021   K 4.0 11/22/2021   CL 99 11/22/2021   CO2 27 11/22/2021   Lab Results  Component Value Date   ALT 25 11/22/2021   AST 21 11/22/2021   ALKPHOS 138 (H) 11/22/2021   BILITOT 0.4 11/22/2021   Lab Results  Component Value Date   HGBA1C 5.9 (H) 11/22/2021   HGBA1C 6.0 (H) 05/08/2021   Lab Results  Component Value Date   INSULIN 17.8 11/22/2021   Lab Results  Component Value Date   TSH 1.030 11/22/2021   Lab Results  Component Value Date   CHOL 118 11/22/2021   HDL 62 11/22/2021   LDLCALC 45 11/22/2021   TRIG 47 11/22/2021   CHOLHDL 2.1 08/11/2021   Lab Results  Component Value Date   WBC 3.3 (L) 11/22/2021   HGB 12.4 11/22/2021   HCT 36.4 11/22/2021   MCV 84 11/22/2021   PLT 245 11/22/2021   No results found for: "IRON", "TIBC", "FERRITIN"  Attestation Statements:   Reviewed by clinician on day  of visit: allergies, medications, problem list, medical history, surgical history, family history, social history, and previous encounter notes.  Time spent on visit including pre-visit chart review and post-visit charting and care was 40 minutes.   I, Trixie Dredge, am acting as transcriptionist for Dennard Nip, MD.  I have reviewed the above documentation for accuracy and completeness, and I agree with the above. - Dennard Nip, MD

## 2021-11-28 ENCOUNTER — Ambulatory Visit
Admission: RE | Admit: 2021-11-28 | Discharge: 2021-11-28 | Disposition: A | Discharge status: Home / Self Care | Payer: Managed Care, Other (non HMO) | Source: Ambulatory Visit | Attending: Pulmonary Disease | Admitting: Pulmonary Disease

## 2021-11-28 DIAGNOSIS — R0602 Shortness of breath: Secondary | ICD-10-CM

## 2021-12-06 ENCOUNTER — Encounter (INDEPENDENT_AMBULATORY_CARE_PROVIDER_SITE_OTHER): Payer: Self-pay | Admitting: Family Medicine

## 2021-12-06 ENCOUNTER — Ambulatory Visit (INDEPENDENT_AMBULATORY_CARE_PROVIDER_SITE_OTHER): Payer: Managed Care, Other (non HMO) | Admitting: Family Medicine

## 2021-12-06 VITALS — BP 135/81 | HR 75 | Temp 98.0°F | Ht 65.0 in | Wt 343.0 lb

## 2021-12-06 DIAGNOSIS — E7849 Other hyperlipidemia: Secondary | ICD-10-CM | POA: Diagnosis not present

## 2021-12-06 DIAGNOSIS — R7303 Prediabetes: Secondary | ICD-10-CM

## 2021-12-06 DIAGNOSIS — M329 Systemic lupus erythematosus, unspecified: Secondary | ICD-10-CM

## 2021-12-06 DIAGNOSIS — E559 Vitamin D deficiency, unspecified: Secondary | ICD-10-CM

## 2021-12-06 DIAGNOSIS — M549 Dorsalgia, unspecified: Secondary | ICD-10-CM | POA: Insufficient documentation

## 2021-12-06 DIAGNOSIS — E669 Obesity, unspecified: Secondary | ICD-10-CM

## 2021-12-06 DIAGNOSIS — E88819 Insulin resistance, unspecified: Secondary | ICD-10-CM | POA: Insufficient documentation

## 2021-12-06 DIAGNOSIS — Z6841 Body Mass Index (BMI) 40.0 and over, adult: Secondary | ICD-10-CM

## 2021-12-06 MED ORDER — METFORMIN HCL 500 MG PO TABS: 500.0000 mg | ORAL_TABLET | Freq: Every day | ORAL | 0 refills | Status: DC

## 2021-12-06 MED ORDER — VITAMIN D (ERGOCALCIFEROL) 1.25 MG (50000 UNIT) PO CAPS: 50000.0000 [IU] | ORAL_CAPSULE | ORAL | 0 refills | Status: DC

## 2021-12-06 MED ORDER — METFORMIN HCL 500 MG PO TABS: 500.0000 mg | ORAL_TABLET | Freq: Two times a day (BID) | ORAL | 0 refills | Status: DC

## 2021-12-18 NOTE — Progress Notes (Unsigned)
Chief Complaint:   OBESITY Julia Booth is here to discuss her progress with her obesity treatment plan along with follow-up of her obesity related diagnoses. Julia Booth is on the Category 3 Plan and states she is following her eating plan approximately 65% of the time. Julia Booth states she is walking for 20 minutes 3 times per week.  Today's visit was #: 2 Starting weight: 345 lbs Starting date: 11/22/2021 Today's weight: 343 lbs Today's date: 12/06/2021 Total lbs lost to date: 2 Total lbs lost since last in-office visit: 2  Interim History: Julia Booth has done well with her weight loss, but she was in the middle of moving and this slowed her process down.  She is now feeling much more organized and back on track, and she found the plan to be workable.  Subjective:   1. Vitamin D deficiency Julia Booth's recent vitamin D level was 25.0.  She has taken vitamin D prescription in the past.  We discussed the goal of 50-60.  I discussed labs with the patient today.  2. Other hyperlipidemia Julia Booth well controlled with taking Crestor with no side effects noted.  I discussed labs with the patient today.  3. Prediabetes Julia Booth's recent A1c was 5.9 and insulin level 17.  We discussed the role of insulin resistance and how health and diet, and weight loss affect and improve insulin resistance.  Provided handouts on insulin resistance, prediabetes, and metformin.  I discussed labs with the patient today.  4. SLE (systemic lupus erythematosus related syndrome) (HCC) Julia Booth is on Plaquenil and prednisone.  She is followed regularly by Rheumatology.  She notes mild leukopenia but no anemia.  I discussed labs with the patient today.  Assessment/Plan:   1. Vitamin D deficiency Julia Booth agreed to begin prescription Vitamin D 50,000 IU every week with no refills. We will follow-up on her vitamin D level in 3-4 months.   - Vitamin D, Ergocalciferol, (DRISDOL) 1.25 MG (50000 UNIT) CAPS  capsule; Take 1 capsule (50,000 Units total) by mouth every 7 (seven) days.  Dispense: 4 capsule; Refill: 0  2. Other hyperlipidemia Stachia will continue Crestor, and she will continue her diet and exercise.   3. Prediabetes Julia Booth agreed to begin metformin 500 mg once daily with no refills.  She will continue with her diet and exercise.  - metFORMIN (GLUCOPHAGE) 500 MG tablet; Take 1 tablet (500 mg total) by mouth daily.  Dispense: 30 tablet; Refill: 0  4. SLE (systemic lupus erythematosus related syndrome) (HCC) Julia Booth is to continue her anti-inflammatory diet, and she will continue to follow-up with Rheumatology.  5. Obesity, Current BMI 57.1 Julia Booth is currently in the action stage of change. As such, her goal is to continue with weight loss efforts. She has agreed to the Category 3 Plan.   Handout on sugar content of fruit was given.   Exercise goals: As is.   Behavioral modification strategies: increasing lean protein intake, decreasing simple carbohydrates, and meal planning and cooking strategies.  Julia Booth has agreed to follow-up with our clinic in 2 to 3 weeks. She was informed of the importance of frequent follow-up visits to maximize her success with intensive lifestyle modifications for her multiple health conditions.   Objective:   Blood pressure 135/81, pulse 75, temperature 98 F (36.7 C), height 5\' 5"  (1.651 m), weight (!) 343 lb (155.6 kg), SpO2 98 %. Body mass index is 57.08 kg/m.  General: Cooperative, alert, well developed, in no acute distress. HEENT: Conjunctivae and lids unremarkable. Cardiovascular: Regular rhythm.  Lungs: Normal work of breathing. Neurologic: No focal deficits.   Lab Results  Component Value Date   CREATININE 0.78 11/22/2021   BUN 10 11/22/2021   NA 140 11/22/2021   K 4.0 11/22/2021   CL 99 11/22/2021   CO2 27 11/22/2021   Lab Results  Component Value Date   ALT 25 11/22/2021   AST 21 11/22/2021   ALKPHOS 138 (H)  11/22/2021   BILITOT 0.4 11/22/2021   Lab Results  Component Value Date   HGBA1C 5.9 (H) 11/22/2021   HGBA1C 6.0 (H) 05/08/2021   Lab Results  Component Value Date   INSULIN 17.8 11/22/2021   Lab Results  Component Value Date   TSH 1.030 11/22/2021   Lab Results  Component Value Date   CHOL 118 11/22/2021   HDL 62 11/22/2021   LDLCALC 45 11/22/2021   TRIG 47 11/22/2021   CHOLHDL 2.1 08/11/2021   Lab Results  Component Value Date   VD25OH 25.0 (L) 11/22/2021   Lab Results  Component Value Date   WBC 3.3 (L) 11/22/2021   HGB 12.4 11/22/2021   HCT 36.4 11/22/2021   MCV 84 11/22/2021   PLT 245 11/22/2021   No results found for: "IRON", "TIBC", "FERRITIN"  Attestation Statements:   Reviewed by clinician on day of visit: allergies, medications, problem list, medical history, surgical history, family history, social history, and previous encounter notes.  Time spent on visit including pre-visit chart review and post-visit care and charting was 40 minutes.   I, Burt Knack, am acting as transcriptionist for Quillian Quince, MD.  I have reviewed the above documentation for accuracy and completeness, and I agree with the above. -  Quillian Quince, MD

## 2021-12-20 ENCOUNTER — Ambulatory Visit (INDEPENDENT_AMBULATORY_CARE_PROVIDER_SITE_OTHER): Payer: Managed Care, Other (non HMO) | Admitting: Family Medicine

## 2021-12-27 ENCOUNTER — Ambulatory Visit (INDEPENDENT_AMBULATORY_CARE_PROVIDER_SITE_OTHER): Payer: Managed Care, Other (non HMO) | Admitting: Family Medicine

## 2021-12-27 ENCOUNTER — Ambulatory Visit (INDEPENDENT_AMBULATORY_CARE_PROVIDER_SITE_OTHER): Payer: Managed Care, Other (non HMO) | Admitting: Physician Assistant

## 2021-12-27 ENCOUNTER — Encounter (INDEPENDENT_AMBULATORY_CARE_PROVIDER_SITE_OTHER): Payer: Self-pay | Admitting: Family Medicine

## 2021-12-27 VITALS — BP 135/82 | HR 69 | Temp 98.1°F | Ht 65.0 in | Wt 341.0 lb

## 2021-12-27 DIAGNOSIS — E559 Vitamin D deficiency, unspecified: Secondary | ICD-10-CM

## 2021-12-27 DIAGNOSIS — R7303 Prediabetes: Secondary | ICD-10-CM | POA: Diagnosis not present

## 2021-12-27 DIAGNOSIS — E669 Obesity, unspecified: Secondary | ICD-10-CM | POA: Diagnosis not present

## 2021-12-27 DIAGNOSIS — M170 Bilateral primary osteoarthritis of knee: Secondary | ICD-10-CM

## 2021-12-27 DIAGNOSIS — Z6841 Body Mass Index (BMI) 40.0 and over, adult: Secondary | ICD-10-CM

## 2021-12-27 MED ORDER — VITAMIN D (ERGOCALCIFEROL) 1.25 MG (50000 UNIT) PO CAPS: 50000.0000 [IU] | ORAL_CAPSULE | ORAL | 0 refills | Status: DC

## 2022-01-09 NOTE — Progress Notes (Unsigned)
Chief Complaint:   OBESITY Julia Booth is here to discuss her progress with her obesity treatment plan along with follow-up of her obesity related diagnoses. Julia Booth is on the Category 3 Plan and states she is following her eating plan approximately 60% of the time. Julia Booth states she is not currently exercising.  Today's visit was #: 3 Starting weight: 345 lbs Starting date: 11/22/2021 Today's weight: 341 lbs Today's date: 12/27/2021 Total lbs lost to date: 4 Total lbs lost since last in-office visit: 2  Interim History: Pt is doing better with meal planning and making grocery shopping list. She has cut back on soda and juice and increased her water intake.her partner is bringing in junk food but that isn't triggering her. Pt has cut back on skipping meals. Her energy level is improving. She has tried out greek yogurt and is getting fruits and vegetables. Pt craves soda and feels hungry 1 hour after meals.  Subjective:   1. Pre-diabetes A1c was 5.9 on 11/22/2021. Pt is actively working on diet and exercise. She did not start Metformin due to fear of side effects.  2. Osteoarthritis of both knees, unspecified osteoarthritis type Condition limits walking. Pt is trying to get BMI under 40 for future Total knee replacement.  3. Vitamin D deficiency Vitamin D level 25 on 11/22/2021. Pt is on prescription Vitamin D weekly.  Assessment/Plan:   1. Pre-diabetes Julia Booth will continue to work on weight loss, exercise, and decreasing simple carbohydrates to help decrease the risk of diabetes.  Consider use of tirzepatide once available. Recheck A1c in 2-3 months.  2. Osteoarthritis of both knees, unspecified osteoarthritis type Pt plans to start use of recumbent bike and swimming. Aim for 3-4 times a week.  3. Vitamin D deficiency Low Vitamin D level contributes to fatigue and are associated with obesity, breast, and colon cancer. She agrees to continue to take prescription  Vitamin D 50,000 IU every week and will follow-up for routine testing of Vitamin D, at least 2-3 times per year to avoid over-replacement.  Refill- Vitamin D, Ergocalciferol, (DRISDOL) 1.25 MG (50000 UNIT) CAPS capsule; Take 1 capsule (50,000 Units total) by mouth every 7 (seven) days.  Dispense: 4 capsule; Refill: 0  4. Obesity,current BMI 56.8 Julia Booth is currently in the action stage of change. As such, her goal is to continue with weight loss efforts. She has agreed to the Category 3 Plan.   Handout: 100 Snack ] Exercise goals:  Pt has a recumbent bike and plans to swim.  Behavioral modification strategies: increasing lean protein intake, increasing water intake, decreasing liquid calories, increasing high fiber foods, decreasing eating out, no skipping meals, meal planning and cooking strategies, keeping healthy foods in the home, and holiday eating strategies .  Julia Booth has agreed to follow-up with our clinic in 3 weeks. She was informed of the importance of frequent follow-up visits to maximize her success with intensive lifestyle modifications for her multiple health conditions.   Objective:   Blood pressure 135/82, pulse 69, temperature 98.1 F (36.7 C), height 5\' 5"  (1.651 m), weight (!) 341 lb (154.7 kg), SpO2 98 %. Body mass index is 56.75 kg/m.  General: Cooperative, alert, well developed, in no acute distress. HEENT: Conjunctivae and lids unremarkable. Cardiovascular: Regular rhythm.  Lungs: Normal work of breathing. Neurologic: No focal deficits.   Lab Results  Component Value Date   CREATININE 0.78 11/22/2021   BUN 10 11/22/2021   NA 140 11/22/2021   K 4.0 11/22/2021  CL 99 11/22/2021   CO2 27 11/22/2021   Lab Results  Component Value Date   ALT 25 11/22/2021   AST 21 11/22/2021   ALKPHOS 138 (H) 11/22/2021   BILITOT 0.4 11/22/2021   Lab Results  Component Value Date   HGBA1C 5.9 (H) 11/22/2021   HGBA1C 6.0 (H) 05/08/2021   Lab Results  Component  Value Date   INSULIN 17.8 11/22/2021   Lab Results  Component Value Date   TSH 1.030 11/22/2021   Lab Results  Component Value Date   CHOL 118 11/22/2021   HDL 62 11/22/2021   LDLCALC 45 11/22/2021   TRIG 47 11/22/2021   CHOLHDL 2.1 08/11/2021   Lab Results  Component Value Date   VD25OH 25.0 (L) 11/22/2021   Lab Results  Component Value Date   WBC 3.3 (L) 11/22/2021   HGB 12.4 11/22/2021   HCT 36.4 11/22/2021   MCV 84 11/22/2021   PLT 245 11/22/2021    Attestation Statements:   Reviewed by clinician on day of visit: allergies, medications, problem list, medical history, surgical history, family history, social history, and previous encounter notes.  I, Kyung Rudd, BS, CMA, am acting as transcriptionist for Seymour Bars, DO.   I have reviewed the above documentation for accuracy and completeness, and I agree with the above. Glennis Brink, DO

## 2022-01-17 ENCOUNTER — Other Ambulatory Visit: Payer: Self-pay | Admitting: Family Medicine

## 2022-01-17 DIAGNOSIS — Z1231 Encounter for screening mammogram for malignant neoplasm of breast: Secondary | ICD-10-CM

## 2022-01-18 ENCOUNTER — Ambulatory Visit (INDEPENDENT_AMBULATORY_CARE_PROVIDER_SITE_OTHER): Payer: Managed Care, Other (non HMO) | Admitting: Physician Assistant

## 2022-01-19 ENCOUNTER — Other Ambulatory Visit (INDEPENDENT_AMBULATORY_CARE_PROVIDER_SITE_OTHER): Payer: Self-pay | Admitting: Family Medicine

## 2022-01-19 DIAGNOSIS — E559 Vitamin D deficiency, unspecified: Secondary | ICD-10-CM

## 2022-01-23 ENCOUNTER — Ambulatory Visit
Admission: RE | Admit: 2022-01-23 | Discharge: 2022-01-23 | Disposition: A | Discharge status: Home / Self Care | Payer: Managed Care, Other (non HMO) | Source: Ambulatory Visit | Attending: Family Medicine | Admitting: Family Medicine

## 2022-01-23 ENCOUNTER — Encounter (INDEPENDENT_AMBULATORY_CARE_PROVIDER_SITE_OTHER): Payer: Self-pay | Admitting: Physician Assistant

## 2022-01-23 ENCOUNTER — Ambulatory Visit (INDEPENDENT_AMBULATORY_CARE_PROVIDER_SITE_OTHER): Payer: Managed Care, Other (non HMO) | Admitting: Physician Assistant

## 2022-01-23 VITALS — BP 148/76 | HR 66 | Temp 98.1°F | Ht 65.0 in | Wt 341.0 lb

## 2022-01-23 DIAGNOSIS — E669 Obesity, unspecified: Secondary | ICD-10-CM | POA: Diagnosis not present

## 2022-01-23 DIAGNOSIS — R7303 Prediabetes: Secondary | ICD-10-CM

## 2022-01-23 DIAGNOSIS — Z6841 Body Mass Index (BMI) 40.0 and over, adult: Secondary | ICD-10-CM | POA: Diagnosis not present

## 2022-01-23 DIAGNOSIS — E559 Vitamin D deficiency, unspecified: Secondary | ICD-10-CM

## 2022-01-23 DIAGNOSIS — Z1231 Encounter for screening mammogram for malignant neoplasm of breast: Secondary | ICD-10-CM

## 2022-01-23 MED ORDER — ZEPBOUND 2.5 MG/0.5ML ~~LOC~~ SOAJ: 2.5000 mg | SUBCUTANEOUS | 0 refills | Status: DC

## 2022-01-23 MED ORDER — VITAMIN D (ERGOCALCIFEROL) 1.25 MG (50000 UNIT) PO CAPS: 50000.0000 [IU] | ORAL_CAPSULE | ORAL | 0 refills | Status: DC

## 2022-01-24 ENCOUNTER — Telehealth (INDEPENDENT_AMBULATORY_CARE_PROVIDER_SITE_OTHER): Payer: Self-pay

## 2022-01-24 NOTE — Telephone Encounter (Signed)
PA submitted for Zepbound 2.5MG /0.5ML Waiting for a response.

## 2022-01-25 NOTE — Telephone Encounter (Signed)
PA for Zepbound was approved for 01/25/22 through 04/26/22 Case [id number]

## 2022-01-26 ENCOUNTER — Encounter (HOSPITAL_BASED_OUTPATIENT_CLINIC_OR_DEPARTMENT_OTHER): Payer: Self-pay | Admitting: Family

## 2022-01-26 ENCOUNTER — Telehealth (HOSPITAL_BASED_OUTPATIENT_CLINIC_OR_DEPARTMENT_OTHER): Payer: Self-pay | Admitting: *Deleted

## 2022-01-26 ENCOUNTER — Ambulatory Visit (HOSPITAL_BASED_OUTPATIENT_CLINIC_OR_DEPARTMENT_OTHER): Payer: Managed Care, Other (non HMO) | Admitting: Family

## 2022-01-26 VITALS — BP 110/76 | HR 66 | Ht 65.0 in | Wt 347.0 lb

## 2022-01-26 DIAGNOSIS — I700 Atherosclerosis of aorta: Secondary | ICD-10-CM | POA: Diagnosis not present

## 2022-01-26 DIAGNOSIS — R0609 Other forms of dyspnea: Secondary | ICD-10-CM | POA: Diagnosis not present

## 2022-01-26 DIAGNOSIS — Z6841 Body Mass Index (BMI) 40.0 and over, adult: Secondary | ICD-10-CM

## 2022-01-26 DIAGNOSIS — Z79899 Other long term (current) drug therapy: Secondary | ICD-10-CM

## 2022-01-26 DIAGNOSIS — G4733 Obstructive sleep apnea (adult) (pediatric): Secondary | ICD-10-CM

## 2022-01-26 DIAGNOSIS — R600 Localized edema: Secondary | ICD-10-CM

## 2022-01-26 NOTE — Telephone Encounter (Addendum)
     Yes both feet, 3 weeks, but the few days have been the worse There is heat in the left foot. Pill: Furosemide Also, there's some chest pain on the left and  shortness of breath.    Marlene Lard, RN  Barnetta Hammersmith E [id number] hours ago (2:14 PM)   KW Good afternoon,    How long has the swelling been going on? Both feet? Any temperature or color change? What medication are you taking to try and reduce fluid?    Best, Marlene Lard, RN    Althea Grimmer K routed conversation to [facility] [id number] hours ago (2:12 PM)   [name]  P [facility] SchedulingYesterday (11:19 AM)   MR Appointment Request From: [name]   With Provider: Chilton Si, MD [[facility]]   Preferred Date Range: 01/26/2022 - 02/01/2022   Preferred Times: Any Time   Reason for visit: Office Visit   Comments: I'm experiencing increased swelling in my feet with pain. I'm releasing fluid even with other medication.  Spoke with patient regarding above message sent via mychart  Patient has had bilateral swelling in feet with one foot worse and becoming painful Increased shortness of breath with chest discomfort at times Discomfort does not last long  Scheduled patient to see Ronn Melena NP today Patient aware of date, time, and location

## 2022-01-26 NOTE — Progress Notes (Unsigned)
Office Visit    Patient Name: Julia Booth Date of Encounter: 01/30/2022  PCP:  Irena Reichmann, DO   [facility]  Cardiologist:  Chilton Si, MD  Advanced Practice Provider:  No care team member to display Electrophysiologist:  None      Chief Complaint    Julia Booth is a 51 y.o. female presents today for edema, chest pain   Past Medical History    Past Medical History:  Diagnosis Date   Aortic atherosclerosis (HCC) 05/08/2021   Essential hypertension 01/05/2021   Exertional dyspnea 01/05/2021   Knee pain    Lupus (HCC)    OSA (obstructive sleep apnea) 01/05/2021   Pre-diabetes    Pulmonary hypertension, unspecified (HCC) 01/05/2021   Past Surgical History:  Procedure Laterality Date   KNEE SURGERY Left 1997   SHOULDER SURGERY Left 2015   UTERINE FIBROID SURGERY  2016    Allergies  Allergies  Allergen Reactions   Penicillins Hives, Other (See Comments) and Rash    NATURAL PENICILLINS:  - Converted from [employer] NATURAL PENICILLINS:  - Converted from [employer]    Sulfa Antibiotics Rash    Other reaction(s): Other (See Comments) Caused flare of her Lupus SULFONAMIDES:  - Converted from [employer] Lupus flare up SULFONAMIDES:  - Converted from [employer] Lupus flare up    Amoxicillin-Pot Clavulanate Rash    History of Present Illness    Julia Booth is a 51 y.o. female with a hx of SLE, aortic atherosclerosis, HTN, HLD, morbid obesity, prediabetes, OSA, COVID 04/2020 last seen 10/10/21.  Diagnosed with SLE in 1998 and follows with rheumatology. Initial cardiology evaluation 01/2021 for shortness of breath and dyspnea. Echo reported with normal LVEF. PFT and sleep test ordered by pulmonology.   Coronary CTA 01/2021 coronary calcium score of 0 and aortic atherosclerosis. CPAP recommended for OSA.  Last seen 10/10/21 by Dr. Duke Salvia. Bp was at goal. Carvedilol increased for palpitations. She was started  on Wegovy.   Presents today after contacting the office noting LE edema and chest pain. Notes her chest pain and dyspnea are occurring more often but feel similar in character to previous episodes. Chest pain described as sharp or dull lasting seconds and self resolving. Notable dyspnea even with activity such as walking to the restroom. She is on new SLE regimen and requiring less Prednisone which she is pleased by. She is travelling to [facility] and wanted to have her ankle swelling addressed prior to travelling. Swelling is bilateral and better in the morning than the evening. Notes her fluid pill does seem to be as effective than previous.   EKGs/Labs/Other Studies Reviewed:   The following studies were reviewed today: CT Coronary Morph 01/18/21 IMPRESSION: 1. No evidence of CAD, CADRADS = 0.   2. Coronary calcium score of 0. This was 0 percentile for age and sex matched control.   3. Normal coronary origin with right dominance.   4. Aortic atherosclersosis.   5. Consider non-coronary causes of dyspnea.   Echo 05/19/20: LVEF 76%.  Normal diastolic function.  LA mildly dilated. Normal RV function.  RVSP 44.6 mmHg.  Trivial MR, trivial AR.   CT Chest 01/02/2021: FINDINGS: Cardiovascular: Atherosclerotic calcification of the aorta and aortic valve. Heart size normal. No pericardial effusion.   Mediastinum/Nodes: No pathologically enlarged mediastinal lymph nodes. Hilar regions are difficult to definitively evaluate without IV contrast. Subpectoral and axillary lymph nodes are subcentimeter in size. Esophagus is grossly unremarkable.   Lungs/Pleura:  Minimal subpleural reticular densities in right upper lobe. Minimal ground-glass and questionable subpleural reticular densities in the inferior right lower lobe and to a lesser extent, inferior left lower lobe. Probable 2 mm smudgy subpleural lymph node along the minor fissure (8/68). No pleural fluid. Airway is unremarkable. There is air  trapping.   Upper Abdomen: Visualized portions of the liver, gallbladder, adrenal glands, kidneys, spleen, pancreas, stomach and bowel are grossly unremarkable. No upper abdominal adenopathy.   Musculoskeletal: Probable hemangioma in the T10 vertebral body. No worrisome lytic or sclerotic lesions.   IMPRESSION: 1. Pulmonary parenchymal findings, as described above, are relatively nonspecific. Early/mild interstitial lung disease such as nonspecific interstitial pneumonitis cannot be excluded. Future evaluation with prone imaging would be helpful. Findings are indeterminate for UIP per consensus guidelines: Diagnosis of Idiopathic Pulmonary Fibrosis: An Official ATS/ERS/JRS/ALAT Clinical Practice Guideline. Am Rosezetta Schlatter Crit Care Med Vol 198, Iss 5, [phone], Oct 06 2016. 2. 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 3.  Aortic atherosclerosis (ICD10-I70.0).  EKG:  EKG is ordered today.  The ekg ordered today demonstrates NSR 66 bpm with incomplete RBBB. Stable compared to previous.   Recent Labs: 11/22/2021: ALT 25; BUN 10; Creatinine, Ser 0.78; Hemoglobin 12.4; Magnesium 1.9; Platelets 245; Potassium 4.0; Sodium 140; TSH 1.030  Recent Lipid Panel    Component Value Date/Time   CHOL 118 11/22/2021 0910   TRIG 47 11/22/2021 0910   HDL 62 11/22/2021 0910   CHOLHDL 2.1 08/11/2021 1640   LDLCALC 45 11/22/2021 0910    Home Medications   Current Meds  Medication Sig   carvedilol (COREG) 12.5 MG tablet Take 1 tablet (12.5 mg total) by mouth 2 (two) times daily.   furosemide (LASIX) 40 MG tablet Take 1 tablet (40 mg total) by mouth daily.   hydroxychloroquine (PLAQUENIL) 200 MG tablet Take 200 mg by mouth 2 (two) times daily.   levocetirizine (XYZAL ALLERGY 24HR) 5 MG tablet    meloxicam (MOBIC) 7.5 MG tablet Take 1 tablet by mouth as needed.   omeprazole (PRILOSEC) 20 MG capsule Take 20 mg by mouth every morning.   predniSONE (DELTASONE) 5 MG tablet Take 1.5 tablets by  mouth daily.   rosuvastatin (CRESTOR) 10 MG tablet Take 1 tablet (10 mg total) by mouth daily.   Tirzepatide-Weight Management (ZEPBOUND) 2.5 MG/0.5ML SOAJ Inject 2.5 mg into the skin once a week.   traMADol (ULTRAM) 50 MG tablet tramadol 50 mg tablet  TAKE 2 TABLETS BY MOUTH 3 TIMES A DAY AS NEEDED   Vitamin D, Ergocalciferol, (DRISDOL) 1.25 MG (50000 UNIT) CAPS capsule Take 1 capsule (50,000 Units total) by mouth every 7 (seven) days.     Review of Systems    All other systems reviewed and are otherwise negative except as noted above.  Physical Exam    VS:  BP 110/76   Pulse 66   Ht 5\' 5"  (1.651 m)   Wt (!) 347 lb (157.4 kg)   BMI 57.74 kg/m  , BMI Body mass index is 57.74 kg/m.  Wt Readings from Last 3 Encounters:  01/26/22 (!) 347 lb (157.4 kg)  01/23/22 (!) 341 lb (154.7 kg)  12/27/21 (!) 341 lb (154.7 kg)     GEN: Well nourished, overweight, well developed, in no acute distress. HEENT: normal. Neck: Supple, no JVD, carotid bruits, or masses. Cardiac: RRR, no murmurs, rubs, or gallops. No clubbing, cyanosis. 1+ bilateral pretibial edema.  Radials/PT 2+ and equal bilaterally.  Respiratory:  Respirations regular and unlabored,  clear to auscultation bilaterally. GI: Soft, nontender, nondistended. MS: No deformity or atrophy. Skin: Warm and dry, no rash. Neuro:  Strength and sensation are intact. Psych: Normal affect.  Assessment & Plan    HTN - BP well controlled. Continue current antihypertensive regimen.   Aortic atherosclerosis - 08/2021 LDL 57. Continue Rosuvastatin.  OSA - CPAP compliance encouraged. Morbid obesity - Weight loss via diet and exercise encouraged. Discussed the impact being overweight would have on cardiovascular risk. Now on Zepbound.  Chest pain - 01/2021 CTA coronary calcium score of 0. Present chest pain atypical for angina as occurs at rest and self resolves. Reassurance provided. No indication for ischemic evaluation. SLE - follows with  rheumatology. Contributory to dyspnea as is obesity. LE edema - likely etiology venous insufficiency. Recommend low salt diet, elevating legs, compressions stockings. Will increase furosemide to 80mg  QD x 2 days then return to 40mg  QD. BMP ,magnesium in 1 week. If no improvement could consider Spironolactone.         Disposition: Follow up  as scheduled  with , MD or APP.  Signed, , NP 01/30/2022, 10:50 AM [facility]

## 2022-01-26 NOTE — Patient Instructions (Signed)
Medication Instructions:  Your physician has recommended you make the following change in your medication:   CHANGE Furosemide to 80mg  (2 tablets) daily for 2 days  THEN return to Furosemide to 40mg  (1 tablet) daily  *If you need a refill on your cardiac medications before your next appointment, please call your pharmacy*   Lab Work: Your physician recommends that you return for lab work on Tuesday or Wednesday for BMP, magnesium at [facility].   If you have labs (blood work) drawn today and your tests are completely normal, you will receive your results only by: MyChart Message (if you have MyChart) OR A paper copy in the mail If you have any lab test that is abnormal or we need to change your treatment, we will call you to review the results.   Testing/Procedures: Your EKG shows normal sinus rhythm which is a good result!   Follow-Up: At [facility], you and your health needs are our priority.  As part of our continuing mission to provide you with exceptional heart care, we have created designated Provider Care Teams.  These Care Teams include your primary Cardiologist (physician) and Advanced Practice Providers (APPs -  Physician Assistants and Nurse Practitioners) who all work together to provide you with the care you need, when you need it.  We recommend signing up for the patient portal called "MyChart".  Sign up information is provided on this After Visit Summary.  MyChart is used to connect with patients for Virtual Visits (Telemedicine).  Patients are able to view lab/test results, encounter notes, upcoming appointments, etc.  Non-urgent messages can be sent to your provider as well.   To learn more about what you can do with MyChart, go to [facility].    Your next appointment:   As scheduled  Other Instructions  To prevent or reduce lower extremity swelling: Eat a low salt diet. Salt makes the body hold onto extra fluid which causes swelling. Sit with  legs elevated. For example, in the recliner or on an ottoman.  Wear knee-high compression stockings during the daytime. Ones labeled 15-20 mmHg provide good compression.

## 2022-01-30 ENCOUNTER — Encounter (HOSPITAL_BASED_OUTPATIENT_CLINIC_OR_DEPARTMENT_OTHER): Payer: Self-pay | Admitting: Family

## 2022-02-02 LAB — BASIC METABOLIC PANEL
BUN/Creatinine Ratio: 15 (ref 9–23)
BUN: 11 mg/dL (ref 6–24)
CO2: 26 mmol/L (ref 20–29)
Calcium: 10 mg/dL (ref 8.7–10.2)
Chloride: 101 mmol/L (ref 96–106)
Creatinine, Ser: 0.73 mg/dL (ref 0.57–1.00)
Glucose: 96 mg/dL (ref 70–99)
Potassium: 4.2 mmol/L (ref 3.5–5.2)
Sodium: 142 mmol/L (ref 134–144)
eGFR: 100 mL/min/{1.73_m2} (ref >59)

## 2022-02-02 LAB — MAGNESIUM: Magnesium: 1.9 mg/dL (ref 1.6–2.3)

## 2022-02-06 ENCOUNTER — Telehealth (INDEPENDENT_AMBULATORY_CARE_PROVIDER_SITE_OTHER): Payer: Self-pay

## 2022-02-06 NOTE — Telephone Encounter (Signed)
PA approved for Zepbound on 01/25/2022 Start date 01/25/22 End date 04/26/22

## 2022-02-08 NOTE — Progress Notes (Signed)
Chief Complaint:   OBESITY Julia Booth is here to discuss her progress with her obesity treatment plan along with follow-up of her obesity related diagnoses. Julia Booth is on the Category 3 Plan and states she is following her eating plan approximately 60% of the time. Julia Booth states she is walking 15 minutes 3 times per week.  Today's visit was #: 4 Starting weight: 345 lbs Starting date: 11/22/2021 Today's weight: 341 lbs Today's date: 01/23/2022 Total lbs lost to date: 4 lbs Total lbs lost since last in-office visit: 0  Interim History: Julia Booth has maintained over past couple of weeks. Struggling with hunger and staying on prescribed nutrition plan.  Interested in medication for weight management.  Has a trip to [place] planned.  Subjective:   1. Pre-diabetes A1c 5.9 on 11/22/21--Does not want to start Metformin due to concerns of side effects. Wants to try Tirzepatide and discussed risks and benefits.  2. Vitamin D deficiency Vit D level of 25 on 11/22/21. Currently taking prescription Vit D 50,000 IU once a week.   Assessment/Plan:   1. Pre-diabetes Continue working on decreasing simple carbs, healthy eating, exercise to promote weight loss. Will recheck A1c in 2-3 months.  2. Vitamin D deficiency Continue/Refill Vit D 50K iU once weekly for 1 month with 0 refills. Will recheck labs in 2-3 months.  -Refill Vitamin D, Ergocalciferol, (DRISDOL) 1.25 MG (50000 UNIT) CAPS capsule; Take 1 capsule (50,000 Units total) by mouth every 7 (seven) days.  Dispense: 4 capsule; Refill: 0  3. Obesity,current BMI 56.8 Start Zebound 2.5 mg SQ once weekly for 1 month with 0 refills. Discussed risks/benefits of Tirzapatide and patient agreeable to. begin Zebound 2.5 mg weekly.  -Start Tirzepatide-Weight Management (ZEPBOUND) 2.5 MG/0.5ML SOAJ; Inject 2.5 mg into the skin once a week.  Dispense: 2 mL; Refill: 0  Julia Booth is currently in the action stage of change. As such, her  goal is to continue with weight loss efforts. She has agreed to the Category 3 Plan.   Exercise goals: As is.  Behavioral modification strategies: increasing lean protein intake, decreasing simple carbohydrates, no skipping meals, meal planning and cooking strategies, and holiday eating strategies .  Julia Booth has agreed to follow-up with our clinic in 4 weeks. She was informed of the importance of frequent follow-up visits to maximize her success with intensive lifestyle modifications for her multiple health conditions.   Objective:   Blood pressure (!) 148/76, pulse 66, temperature 98.1 F (36.7 C), height 5\' 5"  (1.651 m), weight (!) 341 lb (154.7 kg), SpO2 100 %. Body mass index is 56.75 kg/m.  General: Cooperative, alert, well developed, in no acute distress. HEENT: Conjunctivae and lids unremarkable. Cardiovascular: Regular rhythm.  Lungs: Normal work of breathing. Neurologic: No focal deficits.   Lab Results  Component Value Date   CREATININE 0.73 02/01/2022   BUN 11 02/01/2022   NA 142 02/01/2022   K 4.2 02/01/2022   CL 101 02/01/2022   CO2 26 02/01/2022   Lab Results  Component Value Date   ALT 25 11/22/2021   AST 21 11/22/2021   ALKPHOS 138 (H) 11/22/2021   BILITOT 0.4 11/22/2021   Lab Results  Component Value Date   HGBA1C 5.9 (H) 11/22/2021   HGBA1C 6.0 (H) 05/08/2021   Lab Results  Component Value Date   INSULIN 17.8 11/22/2021   Lab Results  Component Value Date   TSH 1.030 11/22/2021   Lab Results  Component Value Date   CHOL 118 11/22/2021  HDL 62 11/22/2021   LDLCALC 45 11/22/2021   TRIG 47 11/22/2021   CHOLHDL 2.1 08/11/2021   Lab Results  Component Value Date   VD25OH 25.0 (L) 11/22/2021   Lab Results  Component Value Date   WBC 3.3 (L) 11/22/2021   HGB 12.4 11/22/2021   HCT 36.4 11/22/2021   MCV 84 11/22/2021   PLT 245 11/22/2021   No results found for: "IRON", "TIBC", "FERRITIN"  Attestation Statements:   Reviewed by  clinician on day of visit: allergies, medications, problem list, medical history, surgical history, family history, social history, and previous encounter notes.  I, Brendell Tyus, am acting as transcriptionist for [employer], PA.  I have reviewed the above documentation for accuracy and completeness, and I agree with the above. -  Kahner Yanik,PA-C

## 2022-02-16 ENCOUNTER — Ambulatory Visit: Payer: Managed Care, Other (non HMO) | Admitting: Pulmonary Disease

## 2022-02-16 ENCOUNTER — Encounter: Payer: Self-pay | Admitting: Pulmonary Disease

## 2022-02-16 VITALS — BP 122/82 | HR 77 | Temp 98.1°F | Ht 65.0 in | Wt 345.6 lb

## 2022-02-16 DIAGNOSIS — G4733 Obstructive sleep apnea (adult) (pediatric): Secondary | ICD-10-CM | POA: Diagnosis not present

## 2022-02-16 DIAGNOSIS — R0602 Shortness of breath: Secondary | ICD-10-CM | POA: Diagnosis not present

## 2022-02-16 NOTE — Progress Notes (Signed)
Julia Booth    [id number]    February 06, 1970  Primary Care Physician:Collins, Hinton Dyer, DO  Referring Physician: Janie Morning, [address],  [place] [address]  Chief complaint:  Consult for dyspnea  HPI: 52 y.o.  with history of hypertension, hyperlipidemia, allergies, sleep apnea, lupus Here to establish care Complains of chronic dyspnea on exertion for the past 8 months.  She gets symptomatic when laying down.  She also has dyspnea on exertion with chest tightness.  Occasional wheezing  She had an echocardiogram done which showed normal LV function and has been referred to cardiology History notable for lupus diagnosed in 1998.  She was initially followed by Dr.Semble at [facility] and then followed with a rheumatologist at [facility].  She has reestablished care with Dr. Dossie Der at [facility] recently Lupus was initially treated with steroids in 2019 and has been maintained on Plaquenil.  Symptoms are mostly joint pain and fatigue  She has history of sleep apnea and is on AutoSet CPAP 5-20.  She had a sleep study done many years ago in [place] and does not have a sleep doctor here  She had COVID-19 in March 2022.  She was evaluated at urgent care and did not require hospitalization.  She was put on steroids for 1 week at that time  I have reviewed her primary care, urgent care visit and other records  Pets: 2 turtles.  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  No feather pillows or comforter Smoking history: Smokes 1 to 2 cigarettes/month for stress relief Travel history: Originally from [place].  She moved to [facility] for 6 years until 2021 Relevant family history: Sister had asthma.  No other significant family history of lung disease  Interim history: Follows with Dr. Dossie Der, rheumatology She has a diagnosis of lupus, connective  tissue disease overlap syndrome Started benlysta in Nov 2023  States that breathing is doing well with no issues.  She does note occasional congestion in the morning when she wakes up Continues on CPAP for OSA  Outpatient Encounter Medications as of 02/16/2022  Medication Sig   carvedilol (COREG) 12.5 MG tablet Take 1 tablet (12.5 mg total) by mouth 2 (two) times daily.   furosemide (LASIX) 40 MG tablet Take 1 tablet (40 mg total) by mouth daily.   hydroxychloroquine (PLAQUENIL) 200 MG tablet Take 200 mg by mouth 2 (two) times daily.   levocetirizine (XYZAL ALLERGY 24HR) 5 MG tablet    meloxicam (MOBIC) 7.5 MG tablet Take 1 tablet by mouth as needed.   omeprazole (PRILOSEC) 20 MG capsule Take 20 mg by mouth every morning.   predniSONE (DELTASONE) 5 MG tablet Take 1.5 tablets by mouth daily.   rosuvastatin (CRESTOR) 10 MG tablet Take 1 tablet (10 mg total) by mouth daily.   Tirzepatide-Weight Management (ZEPBOUND) 2.5 MG/0.5ML SOAJ Inject 2.5 mg into the skin once a week.   traMADol (ULTRAM) 50 MG tablet tramadol 50 mg tablet  TAKE 2 TABLETS BY MOUTH 3 TIMES A DAY AS NEEDED   Vitamin D, Ergocalciferol, (DRISDOL) 1.25 MG (50000 UNIT) CAPS capsule Take 1 capsule (50,000 Units total) by mouth every 7 (seven) days.   No facility-administered encounter medications on file as of 02/16/2022.    Physical Exam: Blood pressure 122/82, pulse 77, temperature 98.1 F (36.7 C), temperature source Oral, height 5\' 5"  (1.651  m), weight (!) 345 lb 9.6 oz (156.8 kg), SpO2 99 %. Gen:      No acute distress HEENT:  EOMI, sclera anicteric Neck:     No masses; no thyromegaly Lungs:    Clear to auscultation bilaterally; normal respiratory effort CV:         Regular rate and rhythm; no murmurs Abd:      + bowel sounds; soft, non-tender; no palpable masses, no distension Ext:    No edema; adequate peripheral perfusion Skin:      Warm and dry; no rash Neuro: alert and oriented x 3 Psych: normal mood and affect    Data Reviewed: Imaging: High-resolution CT 01/02/2021-minimal subpleural densities in the right upper lobe and lower lobe.  2 mm subpleural lymph node along minor fissure.    High resolution CT 11/24/2021-minimal subpleural opacities unchanged from 2022.  At trapping.  PFTs: 02/13/2021 FVC 3.18 [103%], FEV1 2.92 [118%], F/F 92, TLC 4.56 [86%], DLCO 20.95 [94%] Normal test  Labs: CBC 12/12/2020-WBC 3.6, eos 1.4%, absolute eosinophil count 50 IgE 12/12/20-159  Cardiac: Echocardiogram 05/19/2020 Outside report Normal LV size, normal diastolic filling and systolic function.  Calculated EF 75% Normal RV size.  LA and RA cavity is mildly dilated  Sleep: Home sleep study 04/17/2021 Mild OSA.  AHI 10, desats to 80%  Assessment:  Consult for dyspnea Evaluation so far has been okay with follow-up high-res CT showing very minimal nonspecific changes that may be from prior COVID infection.  There is no clear evidence of interstitial lung disease.  She has normal PFTs No eosinophilia or elevated IgE  Suspect weight loss is playing a role in presentation Encouraged weight loss with diet and exercise as body habitus is playing a role in presentation  Lupus Followed with Dr. Dossie Der.  On Benlysta  OSA On auto CPAP 4-20  Plan/Recommendations: Weight loss Continue CPAP PFTs and follow-up in 6 months  Marshell Garfinkel MD [facility] 02/16/2022, 4:15 PM  CC: Janie Morning, DO

## 2022-02-16 NOTE — Patient Instructions (Signed)
His CT scan does not show evidence of interstitial lung disease Continue on CPAP Will get PFTs in 6 months Return to clinic in 6 months after PFTs

## 2022-02-19 ENCOUNTER — Encounter (INDEPENDENT_AMBULATORY_CARE_PROVIDER_SITE_OTHER): Payer: Self-pay | Admitting: Physician Assistant

## 2022-02-19 ENCOUNTER — Ambulatory Visit (INDEPENDENT_AMBULATORY_CARE_PROVIDER_SITE_OTHER): Payer: Managed Care, Other (non HMO) | Admitting: Physician Assistant

## 2022-02-19 VITALS — BP 138/83 | HR 63 | Temp 98.5°F | Ht 65.0 in | Wt 339.0 lb

## 2022-02-19 DIAGNOSIS — E66813 Obesity, class 3: Secondary | ICD-10-CM

## 2022-02-19 DIAGNOSIS — E559 Vitamin D deficiency, unspecified: Secondary | ICD-10-CM | POA: Diagnosis not present

## 2022-02-19 DIAGNOSIS — E669 Obesity, unspecified: Secondary | ICD-10-CM | POA: Diagnosis not present

## 2022-02-19 DIAGNOSIS — Z6841 Body Mass Index (BMI) 40.0 and over, adult: Secondary | ICD-10-CM

## 2022-02-19 DIAGNOSIS — R7303 Prediabetes: Secondary | ICD-10-CM | POA: Diagnosis not present

## 2022-02-19 MED ORDER — VITAMIN D (ERGOCALCIFEROL) 1.25 MG (50000 UNIT) PO CAPS: 50000.0000 [IU] | ORAL_CAPSULE | ORAL | 0 refills | Status: DC

## 2022-02-19 MED ORDER — ZEPBOUND 2.5 MG/0.5ML ~~LOC~~ SOAJ: 2.5000 mg | SUBCUTANEOUS | 0 refills | Status: DC

## 2022-02-26 NOTE — Progress Notes (Signed)
Chief Complaint:   OBESITY Julia Booth is here to discuss her progress with her obesity treatment plan along with follow-up of her obesity related diagnoses. Julia Booth is on the Category 3 Plan and states she is following her eating plan approximately 50% of the time. Julia Booth states she is walking 15-20 minutes 3 times per week.  Today's visit was #: 5 Starting weight: 345 lbs Starting date: 11/22/2021 Today's weight: 339 lbs Today's date: 02/19/2022 Total lbs lost to date: 6 lbs Total lbs lost since last in-office visit: 2  Interim History: Julia Booth has done well with weight loss over the past couple of weeks.  She did not begin Zepbound yet as was unavailable at her pharmacy in [facility] [place]  She reports she is being more mindful of her nutrition plan.  Activity and exercise have been limited by knee pain.  She did go to [place] for vacation over the past couple of weeks and this made it harder to stay on plan.   Subjective:   1. Vitamin D deficiency Level of 25 on 11/22/21-not at goal.  Taking ergocalciferol once weekly--Denies any side effects.  2. Pre-diabetes A1c at 5.9 on 11/22/21- not at goal.  Working on decreasing simple carbs, increasing protein and exercise to promote weight loss and improve glycemic control.  Assessment/Plan:   1. Vitamin D deficiency We will refill Vit D 50K IU once a week for 1 month with 0 refills.  -Refill Vitamin D, Ergocalciferol, (DRISDOL) 1.25 MG (50000 UNIT) CAPS capsule; Take 1 capsule (50,000 Units total) by mouth every 7 (seven) days.  Dispense: 4 capsule; Refill: 0 Continue Ergocalciferol once weekly. And will plan to recheck level in ~3 months to avoid over supplementation.   2. Pre-diabetes   She does not want to use Metformin due to concerns for side effects.  Continue Prescribed Nutrition Plan and exercise to promote weight loss and improve glycemic control. Will start GLP-1-Zepbound 2.5 mg weekly pending availability.  Will recheck labs in 3-4 months.  3. Obesity,current BMI 56.5 Will Start Zepbound 2.5 mg SubQ once a week for 1 month with 0 refills.  -Start tirzepatide (ZEPBOUND) 2.5 MG/0.5ML Pen; Inject 2.5 mg into the skin once a week.  Dispense: 2 mL; Refill: 0  Julia Booth is currently in the action stage of change. As such, her goal is to continue with weight loss efforts. She has agreed to keeping a food journal and adhering to recommended goals of 1500 calories and 100+ grams of protein daily.   Exercise goals: As is.  Behavioral modification strategies: increasing lean protein intake, increasing vegetables, and meal planning and cooking strategies.  Julia Booth has agreed to follow-up with our clinic in 4 weeks. She was informed of the importance of frequent follow-up visits to maximize her success with intensive lifestyle modifications for her multiple health conditions.   Objective:   Blood pressure 138/83, pulse 63, temperature 98.5 F (36.9 C), height 5\' 5"  (1.651 m), weight (!) 339 lb (153.8 kg), SpO2 100 %. Body mass index is 56.41 kg/m.  General: Cooperative, alert, well developed, in no acute distress. HEENT: Conjunctivae and lids unremarkable. Cardiovascular: Regular rhythm.  Lungs: Normal work of breathing. Neurologic: No focal deficits.   Lab Results  Component Value Date   CREATININE 0.73 02/01/2022   BUN 11 02/01/2022   NA 142 02/01/2022   K 4.2 02/01/2022   CL 101 02/01/2022   CO2 26 02/01/2022   Lab Results  Component Value Date  ALT 25 11/22/2021   AST 21 11/22/2021   ALKPHOS 138 (H) 11/22/2021   BILITOT 0.4 11/22/2021   Lab Results  Component Value Date   HGBA1C 5.9 (H) 11/22/2021   HGBA1C 6.0 (H) 05/08/2021   Lab Results  Component Value Date   INSULIN 17.8 11/22/2021   Lab Results  Component Value Date   TSH 1.030 11/22/2021   Lab Results  Component Value Date   CHOL 118 11/22/2021   HDL 62 11/22/2021   LDLCALC 45 11/22/2021   TRIG 47 11/22/2021    CHOLHDL 2.1 08/11/2021   Lab Results  Component Value Date   VD25OH 25.0 (L) 11/22/2021   Lab Results  Component Value Date   WBC 3.3 (L) 11/22/2021   HGB 12.4 11/22/2021   HCT 36.4 11/22/2021   MCV 84 11/22/2021   PLT 245 11/22/2021   No results found for: "IRON", "TIBC", "FERRITIN"  Attestation Statements:   Reviewed by clinician on day of visit: allergies, medications, problem list, medical history, surgical history, family history, social history, and previous encounter notes.  I, Brendell Tyus, am acting as transcriptionist for [employer], PA.  I have reviewed the above documentation for accuracy and completeness, and I agree with the above. -  Edsel Shives,PA-C

## 2022-03-03 ENCOUNTER — Other Ambulatory Visit (INDEPENDENT_AMBULATORY_CARE_PROVIDER_SITE_OTHER): Payer: Self-pay | Admitting: Physician Assistant

## 2022-03-03 DIAGNOSIS — E559 Vitamin D deficiency, unspecified: Secondary | ICD-10-CM

## 2022-03-20 ENCOUNTER — Ambulatory Visit (INDEPENDENT_AMBULATORY_CARE_PROVIDER_SITE_OTHER): Payer: Managed Care, Other (non HMO) | Admitting: Family Medicine

## 2022-03-20 ENCOUNTER — Other Ambulatory Visit (INDEPENDENT_AMBULATORY_CARE_PROVIDER_SITE_OTHER): Payer: Self-pay | Admitting: Family Medicine

## 2022-03-20 ENCOUNTER — Encounter (INDEPENDENT_AMBULATORY_CARE_PROVIDER_SITE_OTHER): Payer: Self-pay | Admitting: Family Medicine

## 2022-03-20 VITALS — BP 138/85 | HR 74 | Temp 98.0°F | Ht 65.0 in | Wt 337.0 lb

## 2022-03-20 DIAGNOSIS — R7303 Prediabetes: Secondary | ICD-10-CM

## 2022-03-20 DIAGNOSIS — E559 Vitamin D deficiency, unspecified: Secondary | ICD-10-CM | POA: Diagnosis not present

## 2022-03-20 DIAGNOSIS — Z6841 Body Mass Index (BMI) 40.0 and over, adult: Secondary | ICD-10-CM

## 2022-03-20 MED ORDER — VITAMIN D (ERGOCALCIFEROL) 1.25 MG (50000 UNIT) PO CAPS: 50000.0000 [IU] | ORAL_CAPSULE | ORAL | 0 refills | Status: DC

## 2022-03-20 NOTE — Telephone Encounter (Signed)
ok 

## 2022-03-28 ENCOUNTER — Telehealth (INDEPENDENT_AMBULATORY_CARE_PROVIDER_SITE_OTHER): Payer: Self-pay | Admitting: Physician Assistant

## 2022-03-28 NOTE — Telephone Encounter (Signed)
PA submitted via CoverMyMeds for Zepbound  Julia Booth (Key: [id number])  [employer] is processing your inquiry and will respond shortly with next steps. You are currently using the fastest method to process this request. Please do not fax or call [employer] to resubmit this request. To check for an update later, open this request again from your dashboard.  If you need assistance, please chat with CoverMyMeds or call us at [phone]

## 2022-03-28 NOTE — Telephone Encounter (Signed)
PA for Zepbound was approved from 03/28/2022 through 11/23/2022

## 2022-04-03 NOTE — Progress Notes (Unsigned)
Chief Complaint:   OBESITY Julia Booth is here to discuss her progress with her obesity treatment plan along with follow-up of her obesity related diagnoses. Julia Booth is on keeping a food journal and adhering to recommended goals of 1500 calories and 100+ grams of protein daily and states she is following her eating plan approximately 60% of the time. Julia Booth states she is walking for 15 minutes 2-3 times per week.  Today's visit was #: 6 Starting weight: 345 lbs Starting date: 11/22/2021 Today's weight: 337 lbs Today's date: 03/20/2022 Total lbs lost to date: 8 Total lbs lost since last in-office visit: 2  Interim History: Julia Booth has had some struggles with stress (family and health). She has not been able to plan meals as much but she is being mindful. She was prescribed Zepbound since she recently started a new Lupus medication.   Subjective:   1. Vitamin D deficiency Julia Booth is on Vitamin D prescription. Her last level was low. No side effects were noted.   2. Pre-diabetes Julia Booth is working on her diet and weight loss. She is doing well with decreasing simple carbohydrates.   Assessment/Plan:   1. Vitamin D deficiency Julia Booth will continue prescription Vitamin D 50,000 IU weekly #4, and we will refill for 1 month.   2. Pre-diabetes Julia Booth will continue her diet and exercise. We will plan to recheck labs in 1 month.   3. BMI 50.0-59.9, adult (HCC)  4. Obesity, Beginning BMI 59.08 Julia Booth is currently in the action stage of change. As such, her goal is to continue with weight loss efforts. She has agreed to the Category 3 Plan or keeping a food journal and adhering to recommended goals of 1500 calories and 100+ grams of protein daily.   Ok to start Zepbound when she wishes but also ok to hold that medicine she prefers.   Exercise goals: As is.   Behavioral modification strategies: increasing lean protein intake, meal planning and cooking strategies,  and travel eating strategies.  Julia Booth has agreed to follow-up with our clinic in 4 weeks. She was informed of the importance of frequent follow-up visits to maximize her success with intensive lifestyle modifications for her multiple health conditions.   Objective:   Blood pressure 138/85, pulse 74, temperature 98 F (36.7 C), height '5\' 5"'$  (1.651 m), weight (!) 337 lb (152.9 kg), SpO2 98 %. Body mass index is 56.08 kg/m.  General: Cooperative, alert, well developed, in no acute distress. HEENT: Conjunctivae and lids unremarkable. Cardiovascular: Regular rhythm.  Lungs: Normal work of breathing. Neurologic: No focal deficits.   Lab Results  Component Value Date   CREATININE 0.73 02/01/2022   BUN 11 02/01/2022   NA 142 02/01/2022   K 4.2 02/01/2022   CL 101 02/01/2022   CO2 26 02/01/2022   Lab Results  Component Value Date   ALT 25 11/22/2021   AST 21 11/22/2021   ALKPHOS 138 (H) 11/22/2021   BILITOT 0.4 11/22/2021   Lab Results  Component Value Date   HGBA1C 5.9 (H) 11/22/2021   HGBA1C 6.0 (H) 05/08/2021   Lab Results  Component Value Date   INSULIN 17.8 11/22/2021   Lab Results  Component Value Date   TSH 1.030 11/22/2021   Lab Results  Component Value Date   CHOL 118 11/22/2021   HDL 62 11/22/2021   LDLCALC 45 11/22/2021   TRIG 47 11/22/2021   CHOLHDL 2.1 08/11/2021   Lab Results  Component Value Date   VD25OH 25.0 (L)  11/22/2021   Lab Results  Component Value Date   WBC 3.3 (L) 11/22/2021   HGB 12.4 11/22/2021   HCT 36.4 11/22/2021   MCV 84 11/22/2021   PLT 245 11/22/2021   No results found for: "IRON", "TIBC", "FERRITIN"  Attestation Statements:   Reviewed by clinician on day of visit: allergies, medications, problem list, medical history, surgical history, family history, social history, and previous encounter notes.   I, Trixie Dredge, am acting as transcriptionist for Dennard Nip, MD.  I have reviewed the above documentation for  accuracy and completeness, and I agree with the above. -  Dennard Nip, MD

## 2022-04-10 ENCOUNTER — Ambulatory Visit (HOSPITAL_BASED_OUTPATIENT_CLINIC_OR_DEPARTMENT_OTHER): Payer: Managed Care, Other (non HMO) | Admitting: Cardiovascular Disease

## 2022-04-18 ENCOUNTER — Ambulatory Visit (INDEPENDENT_AMBULATORY_CARE_PROVIDER_SITE_OTHER): Payer: Managed Care, Other (non HMO) | Admitting: Family Medicine

## 2022-05-08 ENCOUNTER — Ambulatory Visit (HOSPITAL_BASED_OUTPATIENT_CLINIC_OR_DEPARTMENT_OTHER): Payer: Managed Care, Other (non HMO) | Admitting: Family

## 2022-05-08 ENCOUNTER — Encounter (HOSPITAL_BASED_OUTPATIENT_CLINIC_OR_DEPARTMENT_OTHER): Payer: Self-pay | Admitting: Family

## 2022-05-08 VITALS — BP 106/76 | HR 72 | Ht 65.0 in | Wt 331.0 lb

## 2022-05-08 DIAGNOSIS — E785 Hyperlipidemia, unspecified: Secondary | ICD-10-CM | POA: Diagnosis not present

## 2022-05-08 DIAGNOSIS — I10 Essential (primary) hypertension: Secondary | ICD-10-CM

## 2022-05-08 DIAGNOSIS — G4733 Obstructive sleep apnea (adult) (pediatric): Secondary | ICD-10-CM

## 2022-05-08 DIAGNOSIS — Z6841 Body Mass Index (BMI) 40.0 and over, adult: Secondary | ICD-10-CM

## 2022-05-08 DIAGNOSIS — I700 Atherosclerosis of aorta: Secondary | ICD-10-CM | POA: Diagnosis not present

## 2022-05-08 MED ORDER — ROSUVASTATIN CALCIUM 10 MG PO TABS: 10.0000 mg | ORAL_TABLET | Freq: Every day | ORAL | 3 refills | Status: DC

## 2022-05-08 NOTE — Patient Instructions (Signed)
Medication Instructions:  Your physician recommends that you continue on your current medications as directed. Please refer to the Current Medication list given to you today.  We have refilled Crestor Today!  *If you need a refill on your cardiac medications before your next appointment, please call your pharmacy*   Follow-Up: At [facility], you and your health needs are our priority.  As part of our continuing mission to provide you with exceptional heart care, we have created designated Provider Care Teams.  These Care Teams include your primary Cardiologist (physician) and Advanced Practice Providers (APPs -  Physician Assistants and Nurse Practitioners) who all work together to provide you with the care you need, when you need it.  We recommend signing up for the patient portal called "MyChart".  Sign up information is provided on this After Visit Summary.  MyChart is used to connect with patients for Virtual Visits (Telemedicine).  Patients are able to view lab/test results, encounter notes, upcoming appointments, etc.  Non-urgent messages can be sent to your provider as well.   To learn more about what you can do with MyChart, go to [email or web address].    Your next appointment:   6-9 month(s)  Provider:   Skeet Latch, MD

## 2022-05-08 NOTE — Progress Notes (Signed)
Office Visit    Patient Name: Julia Booth Date of Encounter: 05/08/2022  PCP:  Janie Morning, [facility]  Cardiologist:  Skeet Latch, MD  Advanced Practice Provider:  No care team member to display Electrophysiologist:  None   Chief Complaint    Julia Booth is a 52 y.o. female presents today for follow up of edema.  Past Medical History    Past Medical History:  Diagnosis Date   Aortic atherosclerosis 05/08/2021   Essential hypertension 01/05/2021   Exertional dyspnea 01/05/2021   Knee pain    Lupus    OSA (obstructive sleep apnea) 01/05/2021   Pre-diabetes    Pulmonary hypertension, unspecified 01/05/2021   Past Surgical History:  Procedure Laterality Date   KNEE SURGERY Left 1997   SHOULDER SURGERY Left 2015   UTERINE FIBROID SURGERY  2016    Allergies  Allergies  Allergen Reactions   Penicillins Hives, Other (See Comments) and Rash    NATURAL PENICILLINS:  - Converted from Pray:  - Converted from Centricity    Sulfa Antibiotics Rash    Other reaction(s): Other (See Comments) Caused flare of her Lupus SULFONAMIDES:  - Converted from Centricity Lupus flare up SULFONAMIDES:  - Converted from Centricity Lupus flare up    Amoxicillin-Pot Clavulanate Rash    History of Present Illness    Julia Booth is a 52 y.o. female with a hx of SLE, aortic atherosclerosis, HTN, HLD, morbid obesity, prediabetes, OSA, COVID 04/2020 last seen 01/26/2022.  Diagnosed with SLE in 1998 and follows with rheumatology. Initial cardiology evaluation 01/2021 for shortness of breath and dyspnea. Echo reported with normal LVEF. PFT and sleep test ordered by pulmonology.   Coronary CTA 01/2021 coronary calcium score of 0 and aortic atherosclerosis. CPAP recommended for OSA.  Seen 10/10/21 by Dr. Oval Linsey.  Her blood pressure was at goal and carvedilol increased for palpitations. She was started on  Wegovy.   Last seen 01/26/2022 noting presented to have lower extremity edema and chest pain.  Chest pain was atypical for angina as it occurred at rest and self resolved.  Her edema was felt to be related to venous insufficiency and furosemide increased to 80 mg daily for 2 days then return to 40 mg daily.   Presents today for follow-up independently.  Pleasant lady has since [place]  Weight down 16 pounds from clinic visit 4 months ago. Her swelling in intermittent. She has been wearing compression ankle socks. She remains motivated to lost weight.  Not presently taking set boundaries she started [facility] for lupus and was hesitant to take two new medications at one time. Plans to continue to discuss visit pounds with healthy weight and wellness. Reports rare chest pain but reassured by previous normal workup. Her blood pressure at home is checked both prior and after medications with readings such as 141/83, 124/90, 145/93 with heart rate range 67-76 bpm. Herat rates have been 75, 76, 67 bpm.  EKGs/Labs/Other Studies Reviewed:   The following studies were reviewed today:  CT Coronary Morph 01/18/21 IMPRESSION: 1. No evidence of CAD, CADRADS = 0.   2. Coronary calcium score of 0. This was 0 percentile for age and sex matched control.   3. Normal coronary origin with right dominance.   4. Aortic atherosclersosis.   5. Consider non-coronary causes of dyspnea.   Echo 05/19/20: LVEF 76%.  Normal diastolic function.  LA mildly dilated.  Normal RV function.  RVSP 44.6 mmHg.  Trivial MR, trivial AR.   CT Chest 01/02/2021: FINDINGS: Cardiovascular: Atherosclerotic calcification of the aorta and aortic valve. Heart size normal. No pericardial effusion.   Mediastinum/Nodes: No pathologically enlarged mediastinal lymph nodes. Hilar regions are difficult to definitively evaluate without IV contrast. Subpectoral and axillary lymph nodes are subcentimeter in  size. Esophagus is grossly unremarkable.   Lungs/Pleura: Minimal subpleural reticular densities in right upper lobe. Minimal ground-glass and questionable subpleural reticular densities in the inferior right lower lobe and to a lesser extent, inferior left lower lobe. Probable 2 mm smudgy subpleural lymph node along the minor fissure (8/68). No pleural fluid. Airway is unremarkable. There is air trapping.   Upper Abdomen: Visualized portions of the liver, gallbladder, adrenal glands, kidneys, spleen, pancreas, stomach and bowel are grossly unremarkable. No upper abdominal adenopathy.   Musculoskeletal: Probable hemangioma in the T10 vertebral body. No worrisome lytic or sclerotic lesions.   IMPRESSION: 1. Pulmonary parenchymal findings, as described above, are relatively nonspecific. Early/mild interstitial lung disease such as nonspecific interstitial pneumonitis cannot be excluded. Future evaluation with prone imaging would be helpful. Findings are indeterminate for UIP per consensus guidelines: Diagnosis of Idiopathic Pulmonary Fibrosis: An Official ATS/ERS/JRS/ALAT Clinical Practice Guideline. [place], Iss 5, [phone], Oct 06 2016. 2. 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 3.  Aortic atherosclerosis (ICD10-I70.0).  EKG:  EKG is not ordered today.   Recent Labs: 11/22/2021: ALT 25; Hemoglobin 12.4; Platelets 245; TSH 1.030 02/01/2022: BUN 11; Creatinine, Ser 0.73; Magnesium 1.9; Potassium 4.2; Sodium 142  Recent Lipid Panel    Component Value Date/Time   CHOL 118 11/22/2021 0910   TRIG 47 11/22/2021 0910   HDL 62 11/22/2021 0910   CHOLHDL 2.1 08/11/2021 1640   LDLCALC 45 11/22/2021 0910    Home Medications   Current Meds  Medication Sig   BENLYSTA 200 MG/ML SOAJ Inject 1 Syringe into the skin once a week.   carvedilol (COREG) 12.5 MG tablet Take 1 tablet (12.5 mg total) by mouth 2 (two) times daily.   furosemide (LASIX) 40 MG  tablet Take 1 tablet (40 mg total) by mouth daily.   hydroxychloroquine (PLAQUENIL) 200 MG tablet Take 200 mg by mouth 2 (two) times daily.   levocetirizine (XYZAL ALLERGY 24HR) 5 MG tablet    omeprazole (PRILOSEC) 20 MG capsule Take 20 mg by mouth every morning.   Vitamin D, Ergocalciferol, (DRISDOL) 1.25 MG (50000 UNIT) CAPS capsule TAKE 1 CAPSULE (50,000 UNITS TOTAL) BY MOUTH EVERY 7 (SEVEN) DAYS   [DISCONTINUED] rosuvastatin (CRESTOR) 10 MG tablet Take 1 tablet (10 mg total) by mouth daily.     Review of Systems    All other systems reviewed and are otherwise negative except as noted above.  Physical Exam    VS:  BP 106/76   Pulse 72   Ht 5\' 5"  (1.651 m)   Wt (!) 331 lb (150.1 kg)   BMI 55.08 kg/m  , BMI Body mass index is 55.08 kg/m.  Wt Readings from Last 3 Encounters:  05/08/22 (!) 331 lb (150.1 kg)  03/20/22 (!) 337 lb (152.9 kg)  02/19/22 (!) 339 lb (153.8 kg)     GEN: Well nourished, overweight, well developed, in no acute distress. HEENT: normal. Neck: Supple, no JVD, carotid bruits, or masses. Cardiac: RRR, no murmurs, rubs, or gallops. No clubbing, cyanosis. Non pitting bilateral pretibial edema.  Radials/PT 2+ and equal bilaterally.  Respiratory:  Respirations regular and unlabored, clear to auscultation bilaterally. GI: Soft, nontender, nondistended. MS: No deformity or atrophy. Skin: Warm and dry, no rash. Neuro:  Strength and sensation are intact. Psych: Normal affect.  Assessment & Plan    HTN - BP well controlled. Continue current antihypertensive regimen.  Notes occasional elevated readings with systolic in the 0000000 at home but this is prior to medications.  She will check blood pressure consistently after medications and will check in with MyChart message in 2 weeks.  If blood pressure not at goal could consider further increasing dose of carvedilol.  Aortic atherosclerosis/HLD-LDL goal less than 70.  Labs 11/2021 with LDL 45.  Continue rosuvastatin.   Refill provided.    OSA - CPAP compliance encouraged.  Morbid obesity - Weight loss via diet and exercise encouraged. Discussed the impact being overweight would have on cardiovascular risk.  Congratulated on 16 pound weight loss since last clinic visit.  She is following with healthy weight and wellness clinic.  She is considering Zepbound.   Chest pain - 01/2021 CTA coronary calcium score of 0. Reports rare chest discomfort that is atypical for angina.  No indication for ischemic workup.  SLE - follows with rheumatology.  No longer requiring prednisone.  LE edema -echo 05/19/2020 LVEF 75%.  Likely etiology venous insufficiency. Recommend low salt diet, elevating legs, compressions stockings.  Continue furosemide 40 mg daily.   Disposition: Follow up  in 6-9 months  with Skeet Latch, MD or APP.  Signed, Loel Dubonnet, NP 05/08/2022, 10:09 AM [place]

## 2022-05-09 ENCOUNTER — Ambulatory Visit (INDEPENDENT_AMBULATORY_CARE_PROVIDER_SITE_OTHER): Payer: Managed Care, Other (non HMO) | Admitting: Family Medicine

## 2022-05-09 ENCOUNTER — Encounter (INDEPENDENT_AMBULATORY_CARE_PROVIDER_SITE_OTHER): Payer: Self-pay | Admitting: Family Medicine

## 2022-05-09 VITALS — BP 136/82 | HR 64 | Temp 98.1°F | Ht 65.0 in | Wt 327.0 lb

## 2022-05-09 DIAGNOSIS — R7303 Prediabetes: Secondary | ICD-10-CM | POA: Diagnosis not present

## 2022-05-09 DIAGNOSIS — E669 Obesity, unspecified: Secondary | ICD-10-CM

## 2022-05-09 DIAGNOSIS — E7849 Other hyperlipidemia: Secondary | ICD-10-CM | POA: Diagnosis not present

## 2022-05-09 DIAGNOSIS — Z6841 Body Mass Index (BMI) 40.0 and over, adult: Secondary | ICD-10-CM

## 2022-05-09 DIAGNOSIS — E559 Vitamin D deficiency, unspecified: Secondary | ICD-10-CM | POA: Diagnosis not present

## 2022-05-09 MED ORDER — VITAMIN D (ERGOCALCIFEROL) 1.25 MG (50000 UNIT) PO CAPS: 50000.0000 [IU] | ORAL_CAPSULE | ORAL | 0 refills | Status: DC

## 2022-05-09 NOTE — Progress Notes (Signed)
Chief Complaint:   OBESITY Julia Booth is here to discuss her progress with her obesity treatment plan along with follow-up of her obesity related diagnoses. Julia Booth is on the Category 3 Plan or keeping a food journal and adhering to recommended goals of 1500 calories and 100+ grams of protein and states she is following her eating plan approximately 75% of the time. Julia Booth states she is walking for 20 minutes 3 times per week.  Today's visit was #: 7 Starting weight: 345 lbs Starting date: 11/22/2021 Today's weight: 327 lbs Today's date: 05/09/2022 Total lbs lost to date: 18 Total lbs lost since last in-office visit: 10  Interim History: Julia Booth has done well with her weight loss. She is working on being more active and being mindful of her food choices, and she has done especially well with not eating out.   Subjective:   1. Other hyperlipidemia Julia Booth is on Crestor 10 mg, and she is doing well with her diet and exercise. No side effects were noted, and she is due for labs to be rechecked.   2. Prediabetes Julia Booth is working on her diet, and she is not on metformin. She was approved for Zepbound, but she does not feel her hunger is a challenge.   3. Vitamin D deficiency Julia Booth is on Vitamin D, and she is due for labs. She denies nausea, vomiting, or muscle weakness.   Assessment/Plan:   1. Other hyperlipidemia We will check labs today, and Julia Booth will continue her Crestor 10 mg, diet, and exercise.   - Lipid Panel With LDL/HDL Ratio  2. Prediabetes We will check labs today. Julia Booth is ok to hold taking Zepbound for now.   - Vitamin B12 - CMP14+EGFR - Insulin, random - Hemoglobin A1c  3. Vitamin D deficiency We will check labs today. Julia Booth will continue prescription Vitamin D, and  we will refill for 90 days.   - Vitamin D, Ergocalciferol, (DRISDOL) 1.25 MG (50000 UNIT) CAPS capsule; Take 1 capsule (50,000 Units total) by mouth every 7 (seven)  days.  Dispense: 12 capsule; Refill: 0 - VITAMIN D 25 Hydroxy (Vit-D Deficiency, Fractures)  4. BMI 50.0-59.9, adult  5. Obesity, Beginning BMI 59.08 Julia Booth is currently in the action stage of change. As such, her goal is to continue with weight loss efforts. She has agreed to practicing portion control and making smarter food choices, such as increasing vegetables and decreasing simple carbohydrates with 100+ grams of protein.   Exercise goals: As is.   Behavioral modification strategies: increasing lean protein intake.  Julia Booth has agreed to follow-up with our clinic in 4 weeks. She was informed of the importance of frequent follow-up visits to maximize her success with intensive lifestyle modifications for her multiple health conditions.   Julia Booth was informed we would discuss her lab results at her next visit unless there is a critical issue that needs to be addressed sooner. Julia Booth agreed to keep her next visit at the agreed upon time to discuss these results.  Objective:   Blood pressure 136/82, pulse 64, temperature 98.1 F (36.7 C), height 5\' 5"  (1.651 m), weight (!) 327 lb (148.3 kg), SpO2 97 %. Body mass index is 54.42 kg/m.  Lab Results  Component Value Date   CREATININE 0.73 02/01/2022   BUN 11 02/01/2022   NA 142 02/01/2022   K 4.2 02/01/2022   CL 101 02/01/2022   CO2 26 02/01/2022   Lab Results  Component Value Date   ALT 25 11/22/2021  AST 21 11/22/2021   ALKPHOS 138 (H) 11/22/2021   BILITOT 0.4 11/22/2021   Lab Results  Component Value Date   HGBA1C 5.9 (H) 11/22/2021   HGBA1C 6.0 (H) 05/08/2021   Lab Results  Component Value Date   INSULIN 17.8 11/22/2021   Lab Results  Component Value Date   TSH 1.030 11/22/2021   Lab Results  Component Value Date   CHOL 118 11/22/2021   HDL 62 11/22/2021   LDLCALC 45 11/22/2021   TRIG 47 11/22/2021   CHOLHDL 2.1 08/11/2021   Lab Results  Component Value Date   VD25OH 25.0 (L) 11/22/2021    Lab Results  Component Value Date   WBC 3.3 (L) 11/22/2021   HGB 12.4 11/22/2021   HCT 36.4 11/22/2021   MCV 84 11/22/2021   PLT 245 11/22/2021   No results found for: "IRON", "TIBC", "FERRITIN"  Attestation Statements:   Reviewed by clinician on day of visit: allergies, medications, problem list, medical history, surgical history, family history, social history, and previous encounter notes.   I, Burt Knack, am acting as transcriptionist for Quillian Quince, MD.  I have reviewed the above documentation for accuracy and completeness, and I agree with the above. -  Quillian Quince, MD

## 2022-05-10 LAB — CMP14+EGFR
ALT: 22 IU/L (ref 0–32)
AST: 29 IU/L (ref 0–40)
Albumin/Globulin Ratio: 1.6 (ref 1.2–2.2)
Albumin: 4.4 g/dL (ref 3.8–4.9)
Alkaline Phosphatase: 122 IU/L — ABNORMAL HIGH (ref 44–121)
BUN/Creatinine Ratio: 15 (ref 9–23)
BUN: 9 mg/dL (ref 6–24)
Bilirubin Total: 0.3 mg/dL (ref 0.0–1.2)
CO2: 24 mmol/L (ref 20–29)
Calcium: 9.8 mg/dL (ref 8.7–10.2)
Chloride: 104 mmol/L (ref 96–106)
Creatinine, Ser: 0.62 mg/dL (ref 0.57–1.00)
Globulin, Total: 2.8 g/dL (ref 1.5–4.5)
Glucose: 88 mg/dL (ref 70–99)
Potassium: 4.1 mmol/L (ref 3.5–5.2)
Sodium: 143 mmol/L (ref 134–144)
Total Protein: 7.2 g/dL (ref 6.0–8.5)
eGFR: 108 mL/min/{1.73_m2} (ref >59)

## 2022-05-10 LAB — HEMOGLOBIN A1C
Est. average glucose Bld gHb Est-mCnc: 123 mg/dL
Hgb A1c MFr Bld: 5.9 % — ABNORMAL HIGH (ref 4.8–5.6)

## 2022-05-10 LAB — LIPID PANEL WITH LDL/HDL RATIO
Cholesterol, Total: 123 mg/dL (ref 100–199)
HDL: 58 mg/dL (ref >39)
LDL Chol Calc (NIH): 53 mg/dL (ref 0–99)
LDL/HDL Ratio: 0.9 ratio (ref 0.0–3.2)
Triglycerides: 51 mg/dL (ref 0–149)
VLDL Cholesterol Cal: 12 mg/dL (ref 5–40)

## 2022-05-10 LAB — VITAMIN D 25 HYDROXY (VIT D DEFICIENCY, FRACTURES): Vit D, 25-Hydroxy: 34.2 ng/mL (ref 30.0–100.0)

## 2022-05-10 LAB — INSULIN, RANDOM: INSULIN: 11.6 u[IU]/mL (ref 2.6–24.9)

## 2022-05-10 LAB — VITAMIN B12: Vitamin B-12: 536 pg/mL (ref 232–1245)

## 2022-06-06 ENCOUNTER — Ambulatory Visit (INDEPENDENT_AMBULATORY_CARE_PROVIDER_SITE_OTHER): Payer: Managed Care, Other (non HMO) | Admitting: Family Medicine

## 2022-06-19 ENCOUNTER — Ambulatory Visit (INDEPENDENT_AMBULATORY_CARE_PROVIDER_SITE_OTHER): Payer: Managed Care, Other (non HMO) | Admitting: Physician Assistant

## 2022-07-03 ENCOUNTER — Other Ambulatory Visit (INDEPENDENT_AMBULATORY_CARE_PROVIDER_SITE_OTHER): Payer: Self-pay | Admitting: Physician Assistant

## 2022-07-03 DIAGNOSIS — E559 Vitamin D deficiency, unspecified: Secondary | ICD-10-CM

## 2022-07-17 ENCOUNTER — Telehealth: Payer: Self-pay | Admitting: Cardiovascular Disease

## 2022-07-17 MED ORDER — CARVEDILOL 12.5 MG PO TABS: 12.5000 mg | ORAL_TABLET | Freq: Two times a day (BID) | ORAL | 1 refills | Status: DC

## 2022-07-17 NOTE — Telephone Encounter (Signed)
Rx request sent to pharmacy.  

## 2022-07-17 NOTE — Telephone Encounter (Signed)
 *  STAT* If patient is at the pharmacy, call can be transferred to refill team.   1. Which medications need to be refilled? (please list name of each medication and dose if known)   carvedilol (COREG) 12.5 MG tablet    2. Which pharmacy/location (including street and city if local pharmacy) is medication to be sent to? [id number] - [place], [place] - [address]    3. Do they need a 30 day or 90 day supply? 90 days  Pt is out of medications

## 2022-08-26 ENCOUNTER — Encounter: Payer: Self-pay | Admitting: Pulmonary Disease

## 2022-08-26 ENCOUNTER — Encounter (HOSPITAL_BASED_OUTPATIENT_CLINIC_OR_DEPARTMENT_OTHER): Payer: Self-pay | Admitting: Cardiovascular Disease

## 2022-08-29 NOTE — Telephone Encounter (Signed)
Attempted return call message left for patient to return call to better assess reported symptoms. Jim Like MHA RN CCM

## 2022-09-06 ENCOUNTER — Ambulatory Visit (HOSPITAL_BASED_OUTPATIENT_CLINIC_OR_DEPARTMENT_OTHER): Payer: Managed Care, Other (non HMO) | Admitting: Family

## 2022-09-06 MED ORDER — PREDNISONE 10 MG PO TABS: 40.0000 mg | ORAL_TABLET | Freq: Every day | ORAL | 0 refills | Status: AC

## 2022-09-06 MED ORDER — AZITHROMYCIN 500 MG PO TABS: 500.0000 mg | ORAL_TABLET | Freq: Every day | ORAL | 0 refills | Status: AC

## 2022-09-06 NOTE — Telephone Encounter (Signed)
I called and spoke with the patient. Sent a z pack and prednisone 40mg /day for 5 days to her pharmacy. Nothing further needed

## 2022-09-17 ENCOUNTER — Ambulatory Visit (HOSPITAL_BASED_OUTPATIENT_CLINIC_OR_DEPARTMENT_OTHER): Payer: Managed Care, Other (non HMO) | Admitting: Family

## 2022-09-24 ENCOUNTER — Ambulatory Visit (HOSPITAL_BASED_OUTPATIENT_CLINIC_OR_DEPARTMENT_OTHER): Payer: Managed Care, Other (non HMO) | Admitting: Family

## 2022-09-24 ENCOUNTER — Encounter (HOSPITAL_BASED_OUTPATIENT_CLINIC_OR_DEPARTMENT_OTHER): Payer: Self-pay | Admitting: Family

## 2022-09-24 VITALS — BP 128/86 | HR 71 | Ht 65.0 in | Wt 335.0 lb

## 2022-09-24 DIAGNOSIS — Z6841 Body Mass Index (BMI) 40.0 and over, adult: Secondary | ICD-10-CM | POA: Diagnosis not present

## 2022-09-24 DIAGNOSIS — I10 Essential (primary) hypertension: Secondary | ICD-10-CM

## 2022-09-24 DIAGNOSIS — E782 Mixed hyperlipidemia: Secondary | ICD-10-CM

## 2022-09-24 DIAGNOSIS — G4733 Obstructive sleep apnea (adult) (pediatric): Secondary | ICD-10-CM

## 2022-09-24 DIAGNOSIS — R0609 Other forms of dyspnea: Secondary | ICD-10-CM

## 2022-09-24 DIAGNOSIS — R600 Localized edema: Secondary | ICD-10-CM | POA: Diagnosis not present

## 2022-09-24 DIAGNOSIS — I700 Atherosclerosis of aorta: Secondary | ICD-10-CM

## 2022-09-24 LAB — BASIC METABOLIC PANEL
BUN/Creatinine Ratio: 18 (ref 9–23)
BUN: 14 mg/dL (ref 6–24)
CO2: 25 mmol/L (ref 20–29)
Calcium: 10.3 mg/dL — ABNORMAL HIGH (ref 8.7–10.2)
Chloride: 103 mmol/L (ref 96–106)
Creatinine, Ser: 0.8 mg/dL (ref 0.57–1.00)
Glucose: 106 mg/dL — ABNORMAL HIGH (ref 70–99)
Potassium: 4.4 mmol/L (ref 3.5–5.2)
Sodium: 142 mmol/L (ref 134–144)
eGFR: 89 mL/min/{1.73_m2} (ref >59)

## 2022-09-24 MED ORDER — FUROSEMIDE 40 MG PO TABS: ORAL_TABLET | ORAL | 3 refills | Status: DC

## 2022-09-24 NOTE — Patient Instructions (Addendum)
Medication Instructions:  Your physician has recommended you make the following change in your medication:   CHANGE Furosemide to 80mg  (2 tablets) Mondays, Wednesdays, Fridays and 40mg  (1 tablets) every other day  *If you need a refill on your cardiac medications before your next appointment, please call your pharmacy*   Lab Work: Your physician recommends that you return for lab work today: [facility]  Your physician recommends that you return for lab work in 1-2 weeks for: BMP  If you have labs (blood work) drawn today and your tests are completely normal, you will receive your results only by: MyChart Message (if you have MyChart) OR A paper copy in the mail If you have any lab test that is abnormal or we need to change your treatment, we will call you to review the results.   Testing/Procedures: Your physician has requested that you have an echocardiogram. Echocardiography is a painless test that uses sound waves to create images of your heart. It provides your doctor with information about the size and shape of your heart and how well your heart's chambers and valves are working. This procedure takes approximately one hour. There are no restrictions for this procedure. Please do NOT wear cologne, perfume, aftershave, or lotions (deodorant is allowed). Please arrive 15 minutes prior to your appointment time.    Follow-Up: At [facility], you and your health needs are our priority.  As part of our continuing mission to provide you with exceptional heart care, we have created designated Provider Care Teams.  These Care Teams include your primary Cardiologist (physician) and Advanced Practice Providers (APPs -  Physician Assistants and Nurse Practitioners) who all work together to provide you with the care you need, when you need it.  We recommend signing up for the patient portal called "MyChart".  Sign up information is provided on this After Visit Summary.  MyChart is used to  connect with patients for Virtual Visits (Telemedicine).  Patients are able to view lab/test results, encounter notes, upcoming appointments, etc.  Non-urgent messages can be sent to your provider as well.   To learn more about what you can do with MyChart, go to [email or web address].    Your next appointment:   2-3 month(s)  Provider:   Chilton Si, MD or Gillian Shields, NP    Other Instructions  To prevent or reduce lower extremity swelling: Eat a low salt diet. Salt makes the body hold onto extra fluid which causes swelling. Sit with legs elevated. For example, in the recliner or on an ottoman.  Wear knee-high compression stockings during the daytime. Ones labeled 15-20 mmHg provide good compression.    Chronic Venous Insufficiency Chronic venous insufficiency is a condition that causes the veins in the legs to struggle to pump blood from the legs to the heart. It is also called venous stasis. This condition can happen when the vein walls are stretched, weakened, or damaged. It can also happen when the valves inside the vein are damaged. With the right treatment, you should be able to still lead an active life. What are the causes? Common causes of this condition include: Venous hypertension. This is high blood pressure inside the veins. Sitting or standing too long. This can cause increased blood pressure in the veins of the leg. Deep vein thrombosis (DVT). This is a blood clot that blocks blood flow in a vein. Phlebitis. This is inflammation of a vein. It can cause a blood clot to form. An abnormal growth of cells (tumor)  in the area between your hip bones (pelvis). This can cause blood to back up. What increases the risk? Factors that may make you more likely to get this condition include: Having a family history of the condition. Being overweight. Being pregnant. Not getting enough exercise. Smoking. Having a job that requires you to sit or stand in one place for a  long time. Being a certain age. Females in their 78s and 60s and males in their 27s are more likely to get this condition. What are the signs or symptoms? Symptoms of this condition include: Varicose veins. These are veins that are enlarged, bulging, or twisted. Skin breakdown or ulcers. Reddened skin or dark discoloration of the skin on the leg between the knee and ankle. Lipodermatosclerosis. This is brown, smooth, tight, and painful skin just above the ankle. It is often on the inside of the leg. Swelling of the legs. How is this diagnosed? This condition may be diagnosed based on your medical history and a physical exam. You may also need tests, such as: A duplex ultrasound. This shows how blood flows through a blood vessel. Plethysmography. This tests blood flow. Venogram. This looks at the veins using an X-ray and dye. How is this treated? The goals of treatment are to help you return to an active life and to relieve pain. Treatment may include: Wearing compression stockings. These do not cure the condition but can help relieve symptoms. They can also help stop your condition from getting worse. Sclerotherapy. This involves injecting a solution to shrink damaged veins. Surgery. This may include: Vein stripping. This is when a diseased vein is taken out. Laser ablation surgery. This is when blood flow is cut off through the vein. Repairing or remaking a valve inside the affected vein. Follow these instructions at home: Lifestyle Do not use any products that contain nicotine or tobacco. These products include cigarettes, chewing tobacco, and vaping devices, such as e-cigarettes. If you need help quitting, ask your health care provider. Stay active. Exercise, walk, or do other activities. Ask your provider what activities are safe for you. General instructions Take over-the-counter and prescription medicines only as told by your provider. Drink enough fluid to keep your pee (urine)  pale yellow. Wear compression stockings as told by your provider. These stockings help to prevent blood clots and reduce swelling in your legs. Keep all follow-up visits. Your provider will check your legs for any changes and adjust your treatment plan as needed. Contact a health care provider if: You have redness, swelling, or more pain in the affected area. You see a red streak or line that goes up or down from the area. You have skin breakdown or skin loss. You get an injury in the affected area. You get a fever. Get help right away if: You have severe pain that does not get better with medicine. You get an injury and an open wound in the affected area. Your foot or ankle becomes numb or weak all of a sudden. You have trouble moving your foot or ankle. Your symptoms do not go away or get worse. You have chest pain. You have shortness of breath. These symptoms may be an emergency. Get help right away. Call 911. Do not wait to see if the symptoms will go away. Do not drive yourself to the hospital. This information is not intended to replace advice given to you by your health care provider. Make sure you discuss any questions you have with your health care provider.  Document Revised: 02/06/2022 Document Reviewed: 02/06/2022 Elsevier Patient Education  2024 [employer].

## 2022-09-24 NOTE — Progress Notes (Signed)
Cardiology Office Note:  .   Date:  09/24/2022  ID:  [name], DOB [birth year], MRN [id number] PCP: Irena Reichmann, DO  [facility] Providers Cardiologist:  Chilton Si, MD    History of Present Illness: Julia Booth   Julia Booth is a 52 y.o. female  with a hx of SLE, aortic atherosclerosis, HTN, HLD, morbid obesity, prediabetes, OSA, COVID 04/2020.   Diagnosed with SLE in 1998 and follows with rheumatology. Initial cardiology evaluation 01/2021 for shortness of breath and dyspnea. Echo reported with normal LVEF. PFT and sleep test ordered by pulmonology.    Coronary CTA 01/2021 coronary calcium score of 0 and aortic atherosclerosis. CPAP recommended for OSA.   Seen 10/10/21 by Dr. Duke Salvia.  Her blood pressure was at goal and carvedilol increased for palpitations. She was started on Wegovy.    Seen 01/26/2022 noting presented to have lower extremity edema and chest pain.  Chest pain was atypical for angina as it occurred at rest and self resolved.  Edema was felt to be related to venous insufficiency and furosemide increased to 80 mg daily for 2 days then return to 40 mg daily.   Last seen 05/08/2022.  She had successful weight loss.  Had a chest discomfort which was atypical for angina with no testing recommended.    Presents today for follow-up independently.  Pleasant lady has since her time between [place] and [place]. Weight up 8 pounds from last clinic visit 8 months ago. Notes her LE edema has been worse than baseline bilaterally around her ankles, feet. She notes she has had pitting edema intermittently. She also notes some swelling in her hands. She has taken extra doses of Lasix intermittently which helped for a couple days then swelling would return. Notes more dyspnea which is attributes to having COVID after returning from trip to [place]. Daphine Deutscher. Notes exertional dyspnea, mucus. She reports dizziness when sitting described as "waviness" and then spinning on  standing. No hypotensive BP readings at home. She notes left sided "inner" chest discomfort intermittent at rest or with activity not associated with dyspnea that self resolve.   Home cuff: 138/90 Manual cuff: 128/86  ROS: Please see the history of present illness.    All other systems reviewed and are negative.   Studies Reviewed: Julia Booth   EKG Interpretation Date/Time:  Monday September 24 2022 08:56:04 EDT Ventricular Rate:  71 PR Interval:  148 QRS Duration:  104 QT Interval:  388 QTC Calculation: 421 R Axis:   8  Text Interpretation: Normal sinus rhythm Incomplete right bundle branch block Confirmed by Gillian Shields ([id number]) on 09/24/2022 9:01:41 AM    Cardiac Studies & Procedures          CT SCANS  CT CORONARY MORPH W/CTA COR W/SCORE 01/18/2021  Addendum 01/18/2021 10:25 AM ADDENDUM REPORT: 01/18/2021 10:22  HISTORY: 52 yo female with dyspnea on exertion (DOE)  EXAM: Cardiac/Coronary CTA  TECHNIQUE: The patient was scanned on a Office manager.  PROTOCOL: A 110 kV prospective scan was triggered in the descending thoracic aorta at 111 HU's. Axial non-contrast 3 mm slices were carried out through the heart. The data set was analyzed on a dedicated work station and scored using the Agatson method. Gantry rotation speed was 250 msecs and collimation was .6 mm. Beta blockade and 0.8 mg of sl NTG was given. The 3D data set was reconstructed in 5% intervals of the 35-75 % of the R-R cycle. Diastolic phases were analyzed on a dedicated  work station using MPR, MIP and VRT modes. The patient received OMNIPAQUE IOHEXOL 350 MG/ML SOLN of contrast.  FINDINGS: Quality: Fair, attenuation artifact, bolus timing off, HR 63  Coronary calcium score: The patient's coronary artery calcium score is 0, which places the patient in the 0 percentile.  Coronary arteries: Normal coronary origins.  Right dominance.  Right Coronary Artery: Dominant.  Normal artery.  Left Main  Coronary Artery: Normal. Bifurcates into the LAD and LCx arteries.  Left Anterior Descending Coronary Artery: Large anterior artery that extends around the apex. No disease.  Left Circumflex Artery: Tortuous, lateral vessel without disease. Small distal OM branch without disease.  Aorta: Normal size, 28 mm at the mid ascending aorta (level of the PA bifurcation) measured double oblique. Aortic atherosclerosis. No dissection.  Aortic Valve: Trileaflet. No calcifications.  Other findings:  Normal pulmonary vein drainage into the left atrium.  Normal left atrial appendage without a thrombus.  Normal size of the pulmonary artery.  IMPRESSION: 1. No evidence of CAD, CADRADS = 0.  2. Coronary calcium score of 0. This was 0 percentile for age and sex matched control.  3. Normal coronary origin with right dominance.  4. Aortic atherosclersosis.  5. Consider non-coronary causes of dyspnea.   Electronically Signed By: Chrystie Nose M.D. On: 01/18/2021 10:22  Narrative EXAM: OVER-READ INTERPRETATION  CT CHEST  The following report is an over-read performed by radiologist Dr. Jeronimo Greaves of [facility] on 01/18/2021. This over-read does not include interpretation of cardiac or coronary anatomy or pathology. The coronary CTA interpretation by the cardiologist is attached.  COMPARISON:  01/02/2021 high-resolution chest CT.  FINDINGS: Vascular: Aortic atherosclerosis. No central pulmonary embolism, on this non-dedicated study.  Mediastinum/Nodes: No imaged thoracic adenopathy.  Lungs/Pleura: No pleural fluid.  Right lower lobe scarring.  Upper Abdomen: Normal imaged portions of the liver, stomach.  Musculoskeletal: No acute osseous abnormality.  IMPRESSION: No acute findings in the imaged extracardiac chest.  Aortic Atherosclerosis (ICD10-I70.0).  Electronically Signed: By: Jeronimo Greaves M.D. On: 01/18/2021 08:49          Risk  Assessment/Calculations:             Physical Exam:   VS:  BP 126/88   Pulse 71   Ht 5\' 5"  (1.651 m)   Wt (!) 335 lb (152 kg)   BMI 55.75 kg/m    Wt Readings from Last 3 Encounters:  09/24/22 (!) 335 lb (152 kg)  05/09/22 (!) 327 lb (148.3 kg)  05/08/22 (!) 331 lb (150.1 kg)    GEN: Well nourished, overweight, well developed in no acute distress NECK: No JVD; No carotid bruits CARDIAC: RRR, no murmurs, rubs, gallops; chest wall tender on palpation RESPIRATORY:  Clear to auscultation without rales, wheezing or rhonchi  ABDOMEN: Soft, non-tender, non-distended EXTREMITIES:  Bilateral LE with nonpitting edema; No deformity   ASSESSMENT AND PLAN: .    HTN - BP well controlled. Increase Lasix, as below for edema. Home BP cuff found to read 10 points high systolic. Discussed to monitor BP at home at least 2 hours after medications and sitting for 5-10 minutes.    Aortic atherosclerosis/HLD-LDL goal less than 70.  Labs 11/2021 with LDL 45.  Continue rosuvastatin.  Refill provided.     OSA - CPAP compliance encouraged.   Morbid obesity - Weight loss via diet and exercise encouraged. Discussed the impact being overweight would have on cardiovascular risk. She is following with healthy weight and wellness clinic.  Chest pain - 01/2021 CTA coronary calcium score of 0. Occasional left sided chest discomfort but also tender on palpation. Plan for echo, as below.    SLE - Follows with rheumatology.  No longer requiring prednisone.   LE edema / Exertional dyspnea -echo 05/19/2020 LVEF 75%.  Likely etiology venous insufficiency. Recommend low salt diet, elevating legs, compressions stockings.  However, due to worsening dyspnea post COVID and edema will update echocardiogram. Increase Furosemide to 80mg  M, W, F and 40mg  all other days. BMP today and in 1-2 weeks.        Dispo: follow up in 2-3 months  Signed, Alver Sorrow, NP

## 2022-10-16 ENCOUNTER — Other Ambulatory Visit (HOSPITAL_BASED_OUTPATIENT_CLINIC_OR_DEPARTMENT_OTHER): Payer: Managed Care, Other (non HMO)

## 2022-10-16 ENCOUNTER — Ambulatory Visit (HOSPITAL_BASED_OUTPATIENT_CLINIC_OR_DEPARTMENT_OTHER): Payer: Managed Care, Other (non HMO)

## 2022-10-16 DIAGNOSIS — R0600 Dyspnea, unspecified: Secondary | ICD-10-CM

## 2022-10-16 DIAGNOSIS — R600 Localized edema: Secondary | ICD-10-CM

## 2022-10-16 DIAGNOSIS — R0609 Other forms of dyspnea: Secondary | ICD-10-CM

## 2022-10-16 LAB — ECHOCARDIOGRAM COMPLETE
Area-P 1/2: 3.42 cm2
S' Lateral: 2.32 cm

## 2022-10-17 ENCOUNTER — Encounter: Payer: Self-pay | Admitting: Pulmonary Disease

## 2022-10-29 ENCOUNTER — Ambulatory Visit: Payer: Managed Care, Other (non HMO) | Admitting: Pulmonary Disease

## 2022-10-29 DIAGNOSIS — R0602 Shortness of breath: Secondary | ICD-10-CM

## 2022-10-29 NOTE — Progress Notes (Signed)
Full PFT performed today. °

## 2022-10-29 NOTE — Patient Instructions (Signed)
Full PFT performed today. °

## 2022-10-31 ENCOUNTER — Ambulatory Visit (INDEPENDENT_AMBULATORY_CARE_PROVIDER_SITE_OTHER): Payer: Managed Care, Other (non HMO) | Admitting: Family Medicine

## 2022-10-31 ENCOUNTER — Encounter (INDEPENDENT_AMBULATORY_CARE_PROVIDER_SITE_OTHER): Payer: Self-pay | Admitting: Family Medicine

## 2022-10-31 VITALS — BP 140/79 | HR 70 | Temp 98.4°F | Ht 65.0 in | Wt 329.0 lb

## 2022-10-31 DIAGNOSIS — E669 Obesity, unspecified: Secondary | ICD-10-CM

## 2022-10-31 DIAGNOSIS — E559 Vitamin D deficiency, unspecified: Secondary | ICD-10-CM | POA: Diagnosis not present

## 2022-10-31 DIAGNOSIS — E7849 Other hyperlipidemia: Secondary | ICD-10-CM

## 2022-10-31 DIAGNOSIS — Z6841 Body Mass Index (BMI) 40.0 and over, adult: Secondary | ICD-10-CM

## 2022-10-31 LAB — PULMONARY FUNCTION TEST
DL/VA % pred: 103 %
DL/VA: 4.38 ml/min/mmHg/L
DLCO cor % pred: 89 %
DLCO cor: 19.61 ml/min/mmHg
DLCO unc % pred: 89 %
DLCO unc: 19.61 ml/min/mmHg
FEF 25-75 Post: 3.16 L/sec
FEF 25-75 Pre: 4.41 L/sec
FEF2575-%Change-Post: -28 %
FEF2575-%Pred-Post: 114 %
FEF2575-%Pred-Pre: 159 %
FEV1-%Change-Post: -1 %
FEV1-%Pred-Post: 92 %
FEV1-%Pred-Pre: 93 %
FEV1-Post: 2.67 L
FEV1-Pre: 2.71 L
FEV1FVC-%Change-Post: 0 %
FEV1FVC-%Pred-Pre: 111 %
FEV6-%Change-Post: -2 %
FEV6-%Pred-Post: 83 %
FEV6-%Pred-Pre: 85 %
FEV6-Post: 2.98 L
FEV6-Pre: 3.04 L
FEV6FVC-%Pred-Post: 102 %
FEV6FVC-%Pred-Pre: 102 %
FVC-%Change-Post: -2 %
FVC-%Pred-Post: 80 %
FVC-%Pred-Pre: 82 %
FVC-Post: 2.98 L
FVC-Pre: 3.05 L
Post FEV1/FVC ratio: 90 %
Post FEV6/FVC ratio: 100 %
Pre FEV1/FVC ratio: 89 %
Pre FEV6/FVC Ratio: 100 %
RV % pred: 68 %
RV: 1.3 L
TLC % pred: 90 %
TLC: 4.75 L

## 2022-10-31 MED ORDER — VITAMIN D (ERGOCALCIFEROL) 1.25 MG (50000 UNIT) PO CAPS: 50000.0000 [IU] | ORAL_CAPSULE | ORAL | 0 refills | Status: DC

## 2022-11-01 NOTE — Progress Notes (Signed)
Chief Complaint:   OBESITY Julia Booth is here to discuss her progress with her obesity treatment plan along with follow-up of her obesity related diagnoses. Julia Booth is on practicing portion control and making smarter food choices, such as increasing vegetables and decreasing simple carbohydrates with 100+ grams of protein and states she is following her eating plan approximately 0% of the time. Julia Booth states she is doing 0 minutes 0 times per week.  Today's visit was #: 8 Starting weight: 345 lbs Starting date: 11/22/2021 Today's weight: 329 lbs Today's date: 10/31/2022 Total lbs lost to date: 16 Total lbs lost since last in-office visit: 0  Interim History: Patient continues to try to eat healthier.  She is recovering from COVID and she still notes some fatigue.  She is retaining a bit of fluid as well.  Subjective:   1. Vitamin D deficiency Patient is on vitamin D, but her level is not yet at goal.  She requests a refill today.  I discussed labs with the patient today.  2. Other hyperlipidemia Patient is on Crestor, and she is working on her diet to help improve her levels and decrease the risk of heart disease.  I discussed labs with the patient today.  Assessment/Plan:   1. Vitamin D deficiency Patient will continue prescription vitamin D 50,000 IU once every 7 days, and we will refill for 90 days.  - Vitamin D, Ergocalciferol, (DRISDOL) 1.25 MG (50000 UNIT) CAPS capsule; Take 1 capsule (50,000 Units total) by mouth every 7 (seven) days.  Dispense: 12 capsule; Refill: 0  2. Other hyperlipidemia Patient will continue Crestor, and will continue with her diet and weight loss.  We will recheck labs in 1 to 2 months.  3. BMI 50.0-59.9, adult (HCC)  4. Obesity, Beginning BMI 59.08 Julia Booth is currently in the action stage of change. As such, her goal is to continue with weight loss efforts. She has agreed to change to the Category 3 Plan.   Behavioral modification  strategies: decreasing sodium intake and no skipping meals.  Julia Booth has agreed to follow-up with our clinic in 4 weeks. She was informed of the importance of frequent follow-up visits to maximize her success with intensive lifestyle modifications for her multiple health conditions.   Objective:   Blood pressure (!) 140/79, pulse 70, temperature 98.4 F (36.9 C), height 5\' 5"  (1.651 m), weight (!) 329 lb (149.2 kg), SpO2 97%. Body mass index is 54.75 kg/m.  Lab Results  Component Value Date   CREATININE 0.80 09/24/2022   BUN 14 09/24/2022   NA 142 09/24/2022   K 4.4 09/24/2022   CL 103 09/24/2022   CO2 25 09/24/2022   Lab Results  Component Value Date   ALT 22 05/09/2022   AST 29 05/09/2022   ALKPHOS 122 (H) 05/09/2022   BILITOT 0.3 05/09/2022   Lab Results  Component Value Date   HGBA1C 5.9 (H) 05/09/2022   HGBA1C 5.9 (H) 11/22/2021   HGBA1C 6.0 (H) 05/08/2021   Lab Results  Component Value Date   INSULIN 11.6 05/09/2022   INSULIN 17.8 11/22/2021   Lab Results  Component Value Date   TSH 1.030 11/22/2021   Lab Results  Component Value Date   CHOL 123 05/09/2022   HDL 58 05/09/2022   LDLCALC 53 05/09/2022   TRIG 51 05/09/2022   CHOLHDL 2.1 08/11/2021   Lab Results  Component Value Date   VD25OH 34.2 05/09/2022   VD25OH 25.0 (L) 11/22/2021   Lab Results  Component Value Date   WBC 3.3 (L) 11/22/2021   HGB 12.4 11/22/2021   HCT 36.4 11/22/2021   MCV 84 11/22/2021   PLT 245 11/22/2021   No results found for: "IRON", "TIBC", "FERRITIN"  Attestation Statements:   Reviewed by clinician on day of visit: allergies, medications, problem list, medical history, surgical history, family history, social history, and previous encounter notes.  I have personally spent 40 minutes total time today in preparation, patient care, and documentation for this visit, including the following: review of clinical lab tests; review of medical  tests/procedures/services.   I, Burt Knack, am acting as transcriptionist for Quillian Quince, MD.  I have reviewed the above documentation for accuracy and completeness, and I agree with the above. -  Quillian Quince, MD

## 2022-11-02 ENCOUNTER — Telehealth: Payer: Self-pay | Admitting: Pulmonary Disease

## 2022-11-02 DIAGNOSIS — J849 Interstitial pulmonary disease, unspecified: Secondary | ICD-10-CM

## 2022-11-02 NOTE — Telephone Encounter (Signed)
PT calling for PFT results. Her # is [phone]

## 2022-11-08 NOTE — Telephone Encounter (Signed)
I called and discussed with patient.  Though her PFTs are normal she is reporting increased dyspnea I have ordered a high-resolution CT to be done at next available Please make an appointment to see me or APP at the next available opening after the CT scan is done

## 2022-11-08 NOTE — Telephone Encounter (Signed)
1[facility] avail 11/20 pt declined Appt scheduled 11/25 @ 4pm.

## 2022-11-22 ENCOUNTER — Other Ambulatory Visit: Payer: Managed Care, Other (non HMO)

## 2022-12-06 ENCOUNTER — Ambulatory Visit (INDEPENDENT_AMBULATORY_CARE_PROVIDER_SITE_OTHER): Payer: Managed Care, Other (non HMO) | Admitting: Family Medicine

## 2022-12-20 ENCOUNTER — Ambulatory Visit (INDEPENDENT_AMBULATORY_CARE_PROVIDER_SITE_OTHER): Payer: Managed Care, Other (non HMO) | Admitting: Adult Health

## 2022-12-24 ENCOUNTER — Ambulatory Visit (HOSPITAL_BASED_OUTPATIENT_CLINIC_OR_DEPARTMENT_OTHER): Payer: Managed Care, Other (non HMO) | Admitting: Family

## 2022-12-31 ENCOUNTER — Ambulatory Visit: Payer: Managed Care, Other (non HMO) | Admitting: Nurse Practitioner

## 2023-01-07 ENCOUNTER — Ambulatory Visit (INDEPENDENT_AMBULATORY_CARE_PROVIDER_SITE_OTHER): Payer: Managed Care, Other (non HMO) | Admitting: Family Medicine

## 2023-01-07 ENCOUNTER — Encounter (INDEPENDENT_AMBULATORY_CARE_PROVIDER_SITE_OTHER): Payer: Self-pay | Admitting: Family Medicine

## 2023-01-07 VITALS — BP 146/78 | HR 66 | Temp 98.4°F | Ht 65.0 in | Wt 322.0 lb

## 2023-01-07 DIAGNOSIS — E559 Vitamin D deficiency, unspecified: Secondary | ICD-10-CM | POA: Diagnosis not present

## 2023-01-07 DIAGNOSIS — R7303 Prediabetes: Secondary | ICD-10-CM

## 2023-01-07 DIAGNOSIS — E669 Obesity, unspecified: Secondary | ICD-10-CM | POA: Diagnosis not present

## 2023-01-07 DIAGNOSIS — I10 Essential (primary) hypertension: Secondary | ICD-10-CM | POA: Diagnosis not present

## 2023-01-07 DIAGNOSIS — Z6841 Body Mass Index (BMI) 40.0 and over, adult: Secondary | ICD-10-CM

## 2023-01-07 DIAGNOSIS — E66813 Obesity, class 3: Secondary | ICD-10-CM

## 2023-01-07 DIAGNOSIS — I158 Other secondary hypertension: Secondary | ICD-10-CM | POA: Insufficient documentation

## 2023-01-07 MED ORDER — VITAMIN D (ERGOCALCIFEROL) 1.25 MG (50000 UNIT) PO CAPS: 50000.0000 [IU] | ORAL_CAPSULE | ORAL | 0 refills | Status: DC

## 2023-01-07 NOTE — Progress Notes (Signed)
Carlye Grippe, D.O.  ABFM, ABOM Specializing in Clinical Bariatric Medicine  Office located at: [address] [place], [place]  [address]   Assessment and Plan:  Recommend pt come in prior for fasting IC and labs FOR THE DISEASE OF OBESITY: Obesity, Beginning BMI 59.08 Obesity,current BMI 53.58 Since last office visit on 10/31/2022 patient's  Muscle mass has decreased by 1.8lb. Fat mass has decreased by 5.8lb. Total body water has decreased by 1.8lb.  Counseling done on how various foods will affect these numbers and how to maximize success  Total lbs lost to date: 23 Total weight loss percentage to date: 6.76    Recommended Dietary Goals Julia Booth is currently in the action stage of change. As such, her goal is to continue weight management plan.  She has agreed to: continue current plan  - I counseled pt on how to get the right amount of portions using a index card.   - Pt RMR should be 2181 and is 1987 as of 11/22/2021.  Behavioral Intervention We discussed the following today: increasing lean protein intake to established goals, work on tracking and journaling calories using tracking application, continue to practice mindfulness when eating, and planning for success  Additional resources provided today:  portion control and strategies for the Holidays  Evidence-based interventions for health behavior change were utilized today including the discussion of self monitoring techniques, problem-solving barriers and SMART goal setting techniques.   Regarding patient's less desirable eating habits and patterns, we employed the technique of small changes.   Pt will specifically work on: pay attention/track portions and increase exercise for next visit.    Recommended Physical Activity Goals Julia Booth has been advised to work up to 150 minutes of moderate intensity aerobic activity a week and strengthening exercises 2-3 times per week for cardiovascular health,  weight loss maintenance and preservation of muscle mass.   She has agreed to :  Increase the intensity, frequency or duration of aerobic exercises     Pharmacotherapy We discussed various medication options to help Julia Booth with her weight loss efforts and we both agreed to : continue with nutritional and behavioral strategies   FOR ASSOCIATED CONDITIONS ADDRESSED TODAY: Hypertension, unspecified type Assessment:  Condition is Not at goal.. Pt BP is elevated today at 146/78. This is currently being treated with Coreg and Lasix only which she tolerates well. Pt is asymptomatic and denies any dizziness or light-headiness. Pt feels as work has increased her BP.  Last 3 blood pressure readings in our office are as follows: BP Readings from Last 3 Encounters:  01/07/23 (!) 146/78  10/31/22 (!) 140/79  09/24/22 128/86   Plan:  - Continue Lasix 40mg  two tablets on Mondays, Wednesdays, and Fridays and 1 tablet on the other days, Coreg 12.5mg  BID. Lifestyle changes such as following our low salt, heart healthy meal plan and engaging in a regular exercise program discussed. Avoid buying foods that are: processed, frozen, or prepackaged to avoid excess salt. Ambulatory blood pressure monitoring encouraged. We will continue to monitor closely alongside PCP/ specialists.    Vitamin D deficiency Assessment & Plan:  Condition is Not at goal.. Pt vitamin D level is below optimal range at 34.2 as of April, 2024. This is being treated with ERGO once every 7 days. Pt endorses no adverse side effects on this. Pt is to continue Ergocalciferol 50K IU once weekly. Ideal vitamin D levels reviewed with patient.  she was encouraged to continue to take the medicine  until told otherwise. We will continue to monitor levels regularly (every 3-4 mo on average) to keep levels within normal limits and prevent over supplementation. Vitamin D deficiency -     Vitamin D (Ergocalciferol); Take 1 capsule (50,000 Units total)  by mouth every 7 (seven) days.  Dispense: 12 capsule; Refill: 0   Prediabetes Assessment & Plan:  Condition is Not optimized.. This is diet/exercise controlled. Pt has a elevated A1c at 5.9 and insulin level at 11.6 as of April, 2024. She endorses her hunger and cravings are controlled but might be eating over portions. Pt is to continue to decrease simple carbs/ sugars; increase fiber and proteins -> follow her meal plan.  Julia Booth will continue to work on weight loss, exercise, via their meal plan we devised to help decrease the risk of progressing to diabetes. Anticipatory guidance given.  We will recheck A1c and fasting insulin level in approximately 3 months from last check, or as deemed appropriate.     Follow up:   Return in about 2 weeks (around 01/21/2023). She was informed of the importance of frequent follow up visits to maximize her success with intensive lifestyle modifications for her multiple health conditions.  Subjective:   Chief complaint: Obesity Julia Booth is here to discuss her progress with her obesity treatment plan. She is on the practicing portion control and making smarter food choices, such as increasing vegetables and decreasing simple carbohydrates and states she is following her eating plan approximately 75% of the time. She states she is exercising 15 minutes 3 days per week.  Interval History:  Julia Booth is here for a follow up office visit. This is my first time seeing pt as she typically sees Dr. B. Since last OV,  she has been well. She notes that she did not do the traditional thanksgiving meals. She has worked on increasing her protein and decreasing carbs. She tries to have light meals during the day before having a adequate size meal for dinner. She also notes having occasional fried food. Pt finds getting in protein is a challenge.    Barriers identified:  fried foods .   Pharmacotherapy for weight loss: She is currently taking no anti-obesity  medication.   Review of Systems:  Pertinent positives were addressed with patient today.  Reviewed by clinician on day of visit: allergies, medications, problem list, medical history, surgical history, family history, social history, and previous encounter notes.  Weight Summary and Biometrics   Weight Lost Since Last Visit: 7lb  Weight Gained Since Last Visit: 0lb    Vitals Temp: 98.4 F (36.9 C) BP: (!) 146/78 Pulse Rate: 66 SpO2: 100 %   Anthropometric Measurements Height: 5\' 5"  (1.651 m) Weight: (!) 322 lb (146.1 kg) BMI (Calculated): 53.58 Weight at Last Visit: 329lb Weight Lost Since Last Visit: 7lb Weight Gained Since Last Visit: 0lb Starting Weight: 345lb Total Weight Loss (lbs): 23 lb (10.4 kg)   Body Composition  Body Fat %: 57.5 % Fat Mass (lbs): 185.2 lbs Muscle Mass (lbs): 130 lbs Total Body Water (lbs): 103 lbs Visceral Fat Rating : 23   Other Clinical Data Fasting: no Labs: no Today's Visit #: 9 Starting Date: 11/22/21    Objective:   PHYSICAL EXAM: Blood pressure (!) 146/78, pulse 66, temperature 98.4 F (36.9 C), height 5\' 5"  (1.651 m), weight (!) 322 lb (146.1 kg), SpO2 100%. Body mass index is 53.58 kg/m.  General: she is overweight, cooperative and in no acute distress. PSYCH: Has normal  mood, affect and thought process.   HEENT: EOMI, sclerae are anicteric. Lungs: Normal breathing effort, no conversational dyspnea. Extremities: Moves * 4 Neurologic: A and O * 3, good insight  DIAGNOSTIC DATA REVIEWED: BMET    Component Value Date/Time   NA 142 09/24/2022 0948   K 4.4 09/24/2022 0948   CL 103 09/24/2022 0948   CO2 25 09/24/2022 0948   GLUCOSE 106 (H) 09/24/2022 0948   GLUCOSE 108 (H) 07/19/2010 2200   BUN 14 09/24/2022 0948   CREATININE 0.80 09/24/2022 0948   CALCIUM 10.3 (H) 09/24/2022 0948   GFRNONAA >60 07/19/2010 2200   GFRAA >60 07/19/2010 2200   Lab Results  Component Value Date   HGBA1C 5.9 (H) 05/09/2022    HGBA1C 6.0 (H) 05/08/2021   Lab Results  Component Value Date   INSULIN 11.6 05/09/2022   INSULIN 17.8 11/22/2021   Lab Results  Component Value Date   TSH 1.030 11/22/2021   CBC    Component Value Date/Time   WBC 3.3 (L) 11/22/2021 0910   WBC 3.6 (L) 12/12/2020 0926   RBC 4.32 11/22/2021 0910   RBC 4.53 12/12/2020 0926   HGB 12.4 11/22/2021 0910   HCT 36.4 11/22/2021 0910   PLT 245 11/22/2021 0910   MCV 84 11/22/2021 0910   MCH 28.7 11/22/2021 0910   MCH 28.5 07/19/2010 2200   MCHC 34.1 11/22/2021 0910   MCHC 33.8 12/12/2020 0926   RDW 13.2 11/22/2021 0910   Iron Studies No results found for: "IRON", "TIBC", "FERRITIN", "IRONPCTSAT" Lipid Panel     Component Value Date/Time   CHOL 123 05/09/2022 1306   TRIG 51 05/09/2022 1306   HDL 58 05/09/2022 1306   CHOLHDL 2.1 08/11/2021 1640   LDLCALC 53 05/09/2022 1306   Hepatic Function Panel     Component Value Date/Time   PROT 7.2 05/09/2022 1306   ALBUMIN 4.4 05/09/2022 1306   AST 29 05/09/2022 1306   ALT 22 05/09/2022 1306   ALKPHOS 122 (H) 05/09/2022 1306   BILITOT 0.3 05/09/2022 1306      Component Value Date/Time   TSH 1.030 11/22/2021 0910   Nutritional Lab Results  Component Value Date   VD25OH 34.2 05/09/2022   VD25OH 25.0 (L) 11/22/2021    Attestations:   I, Clinical biochemist, acting as a Stage manager for [employer], DO., have compiled all relevant documentation for today's office visit on behalf of Thomasene Lot, DO, while in the presence of [employer], DO.  Reviewed by clinician on day of visit: allergies, medications, problem list, medical history, surgical history, family history, social history, and previous encounter notes pertinent to patient's obesity diagnosis. preparing to see patient (e.g. review and interpretation of tests, old notes ), obtaining and/or reviewing separately obtained history, performing a medically appropriate examination or evaluation, counseling and  educating the patient, ordering medications, test or procedures, documenting clinical information in the electronic or other health care record, and independently interpreting results and communicating results to the patient, family, or caregiver   I have reviewed the above documentation for accuracy and completeness, and I agree with the above. Carlye Grippe, D.O.  The 21st Century Cures Act was signed into law in 2016 which includes the topic of electronic health records.  This provides immediate access to information in MyChart.  This includes consultation notes, operative notes, office notes, lab results and pathology reports.  If you have any questions about what you read please let us know at your next visit  so we can discuss your concerns and take corrective action if need be.  We are right here with you.

## 2023-01-11 ENCOUNTER — Other Ambulatory Visit: Payer: Self-pay | Admitting: Cardiovascular Disease

## 2023-01-14 ENCOUNTER — Telehealth: Payer: Managed Care, Other (non HMO) | Admitting: Pulmonary Disease

## 2023-01-14 NOTE — Telephone Encounter (Signed)
Patient would like to schedule CT scan. Patient phone number is [phone].

## 2023-01-17 ENCOUNTER — Encounter: Payer: Self-pay | Admitting: Pulmonary Disease

## 2023-02-05 ENCOUNTER — Other Ambulatory Visit: Payer: Managed Care, Other (non HMO)

## 2023-02-08 ENCOUNTER — Ambulatory Visit
Admission: RE | Admit: 2023-02-08 | Discharge: 2023-02-08 | Disposition: A | Discharge status: Home / Self Care | Payer: No Typology Code available for payment source | Source: Ambulatory Visit | Attending: Pulmonary Disease | Admitting: Pulmonary Disease

## 2023-02-08 DIAGNOSIS — J849 Interstitial pulmonary disease, unspecified: Secondary | ICD-10-CM

## 2023-02-11 ENCOUNTER — Ambulatory Visit (INDEPENDENT_AMBULATORY_CARE_PROVIDER_SITE_OTHER): Payer: Managed Care, Other (non HMO) | Admitting: Family Medicine

## 2023-02-11 ENCOUNTER — Encounter (INDEPENDENT_AMBULATORY_CARE_PROVIDER_SITE_OTHER): Payer: Self-pay | Admitting: Family Medicine

## 2023-02-22 ENCOUNTER — Ambulatory Visit: Payer: Self-pay | Admitting: Nurse Practitioner

## 2023-02-22 ENCOUNTER — Encounter: Payer: Self-pay | Admitting: Nurse Practitioner

## 2023-02-22 VITALS — BP 120/86 | HR 82 | Ht 65.0 in | Wt 322.8 lb

## 2023-02-22 DIAGNOSIS — J309 Allergic rhinitis, unspecified: Secondary | ICD-10-CM

## 2023-02-22 DIAGNOSIS — M329 Systemic lupus erythematosus, unspecified: Secondary | ICD-10-CM

## 2023-02-22 DIAGNOSIS — G4733 Obstructive sleep apnea (adult) (pediatric): Secondary | ICD-10-CM

## 2023-02-22 DIAGNOSIS — R059 Cough, unspecified: Secondary | ICD-10-CM

## 2023-02-22 DIAGNOSIS — Z6841 Body Mass Index (BMI) 40.0 and over, adult: Secondary | ICD-10-CM

## 2023-02-22 DIAGNOSIS — J984 Other disorders of lung: Secondary | ICD-10-CM

## 2023-02-22 DIAGNOSIS — E66813 Obesity, class 3: Secondary | ICD-10-CM

## 2023-02-22 DIAGNOSIS — R0609 Other forms of dyspnea: Secondary | ICD-10-CM

## 2023-02-22 LAB — CBC WITH DIFFERENTIAL/PLATELET
Basophils Absolute: 0 10*3/uL (ref 0.0–0.1)
Basophils Relative: 0.7 % (ref 0.0–3.0)
Eosinophils Absolute: 0 10*3/uL (ref 0.0–0.7)
Eosinophils Relative: 0.6 % (ref 0.0–5.0)
HCT: 38.2 % (ref 36.0–46.0)
Hemoglobin: 12.8 g/dL (ref 12.0–15.0)
Lymphocytes Relative: 29.7 % (ref 12.0–46.0)
Lymphs Abs: 1 10*3/uL (ref 0.7–4.0)
MCHC: 33.6 g/dL (ref 30.0–36.0)
MCV: 85.4 fL (ref 78.0–100.0)
Monocytes Absolute: 0.2 10*3/uL (ref 0.1–1.0)
Monocytes Relative: 6.8 % (ref 3.0–12.0)
Neutro Abs: 2.1 10*3/uL (ref 1.4–7.7)
Neutrophils Relative %: 62.2 % (ref 43.0–77.0)
Platelets: 265 10*3/uL (ref 150.0–400.0)
RBC: 4.48 Mil/uL (ref 3.87–5.11)
RDW: 14.9 % (ref 11.5–15.5)
WBC: 3.3 10*3/uL — ABNORMAL LOW (ref 4.0–10.5)

## 2023-02-22 LAB — POCT EXHALED NITRIC OXIDE: FeNO level (ppb): 15

## 2023-02-22 MED ORDER — BUDESONIDE-FORMOTEROL FUMARATE 80-4.5 MCG/ACT IN AERO: 2.0000 | INHALATION_SPRAY | Freq: Two times a day (BID) | RESPIRATORY_TRACT | 12 refills | Status: AC

## 2023-02-22 MED ORDER — ALBUTEROL SULFATE HFA 108 (90 BASE) MCG/ACT IN AERS: 2.0000 | INHALATION_SPRAY | Freq: Four times a day (QID) | RESPIRATORY_TRACT | 2 refills | Status: AC | PRN

## 2023-02-22 NOTE — Progress Notes (Signed)
@Patient  ID: Julia Booth, female    DOB: [birth year], 53 y.o.   MRN: [id number]  Chief Complaint  Patient presents with   Follow-up    CT Results    Referring provider: Irena Reichmann, DO  HPI: 53 year old female, former remote smoker followed for DOE and OSA on CPAP.  She is a patient of Dr. Shirlee More and last seen in office 02/16/2022.  She has a history of SLE and currently on Plaquenil.  Followed by rheumatology.  Other past medical history significant for hypertension, atherosclerosis, PAH, obesity, HLD, prediabetes.  TEST/EVENTS:  01/02/2021 HRCT chest: Minimal subpleural densities in right upper lobe and lower lobe.  2 mm subpleural lymph node along minor fissure 02/13/2021 PFT: FVC 103, FEV1 118, ratio 92, TLC 86, DLCO 94 04/17/2021 HST: Mild OSA; AHI 10, SpO2 low 80 11/28/2021 HRCT chest: Minimal subpleural opacities unchanged from 2022.  Air trapping. 10/29/2022 PFT: FVC 82, FEV1 93, ratio 90, TLC 90, DLCO 89.  No BD.  Normal PFT 02/08/2023 HRCT chest: Atherosclerosis.  Very mild groundglass and septal thickening, most evident in right lung base.  Stable compared to 2022.  Favors mild postinfectious/inflammatory fibrosis.  Mild air trapping.  Hepatic steatosis  02/16/2022: OV with Dr. Isaiah Serge.  Previously establish care for chronic dyspnea on exertion, chest tightness and occasional wheezing.  Echo had showed normal LV function.  Does have a diagnosis of lupus.  Followed by Dr. Kathi Ludwig at [facility]; maintained on Plaquenil.  Has a history of sleep apnea and on auto CPAP.  Had COVID in March 2022.  Did not require hospitalization.  Did complete steroids for a week at that time. Breathing overall doing well with no issues.  Does have occasional congestion in the mornings when she wakes up.  High resolution CT chest showed very minimal nonspecific changes that may be from prior COVID infection.  No clear evidence of ILD.  Has normal PFTs.  Suspect weight is playing a role in presentation.   Encouraged weight loss with diet and exercise.  Continue CPAP.  Follow-up with rheumatology.  02/22/2023: Today -follow-up Discussed the use of AI scribe software for clinical note transcription with the patient, who gave verbal consent to proceed.  History of Present Illness   Miss Sayarath presents for a discussion of recent CT scan results. Mild non-specific changes which are likely postinflammatory/infectious in nature and not significantly changed compared to 2022. The patient's symptoms include occasional wheezing, not associated with any specific activity, and an increase in mucus production with white phlegm, mostly in the mornings. The patient also reports occasional chest tightness, particularly with deep breaths, and has noticed a need to clear mucus from the chest, especially at night/AM. She does feel she gets short winded with certain activities.   The patient has a history of COVID-19 and has been previously prescribed an albuterol inhaler, which was used up during recovery from the infection. The patient reports that the inhaler was beneficial during that time. The patient also takes Xyzal, but not on a daily basis, and only during allergy season. The patient has noticed that symptoms worsen during allergy season, but typically the symptoms are more sinus-related rather than lung-related. She denies any fevers, chills, hemoptysis, leg swelling, orthopnea.   The patient has recently discontinued Benlysta for lupus due to ineffectiveness and is considering returning to prednisone for lupus management. The patient also uses a CPAP machine for sleep, which is managed by the current healthcare team. Sleeps well with CPAP. No issues with  fit. Helps feel more rested. The patient is also working on weight loss.      FeNO 15 ppb   Allergies  Allergen Reactions   Penicillins Hives, Other (See Comments) and Rash    NATURAL PENICILLINS:  - Converted from [employer] NATURAL PENICILLINS:  -  Converted from [employer]    Sulfa Antibiotics Rash    Other reaction(s): Other (See Comments) Caused flare of her Lupus SULFONAMIDES:  - Converted from [employer] Lupus flare up SULFONAMIDES:  - Converted from [employer] Lupus flare up    Amoxicillin-Pot Clavulanate Rash    Immunization History  Administered Date(s) Administered   PFIZER(Purple Top)SARS-COV-2 Vaccination 04/06/2019, 04/27/2019, 03/08/2020    Past Medical History:  Diagnosis Date   Aortic atherosclerosis (HCC) 05/08/2021   Essential hypertension 01/05/2021   Exertional dyspnea 01/05/2021   Knee pain    Lupus    OSA (obstructive sleep apnea) 01/05/2021   Pre-diabetes    Pulmonary hypertension, unspecified (HCC) 01/05/2021    Tobacco History: Social History   Tobacco Use  Smoking Status Former   Types: Cigarettes  Smokeless Tobacco Never  Tobacco Comments   Smokes with stress- 1 time a month average   Counseling given: Not Answered Tobacco comments: Smokes with stress- 1 time a month average   Outpatient Medications Prior to Visit  Medication Sig Dispense Refill   BENLYSTA 200 MG/ML SOAJ Inject 1 Syringe into the skin once a week.     carvedilol (COREG) 12.5 MG tablet TAKE 1 TABLET BY MOUTH 2 TIMES DAILY. 180 tablet 2   furosemide (LASIX) 40 MG tablet Take 2 tablets (80mg ) on Mondays, Wednesdays, Friday and 1 tablet (40mg ) every other day. 135 tablet 3   hydroxychloroquine (PLAQUENIL) 200 MG tablet Take 200 mg by mouth 2 (two) times daily.     levocetirizine (XYZAL ALLERGY 24HR) 5 MG tablet      omeprazole (PRILOSEC) 20 MG capsule Take 20 mg by mouth every morning.     rosuvastatin (CRESTOR) 10 MG tablet Take 1 tablet (10 mg total) by mouth daily. 90 tablet 3   Vitamin D, Ergocalciferol, (DRISDOL) 1.25 MG (50000 UNIT) CAPS capsule Take 1 capsule (50,000 Units total) by mouth every 7 (seven) days. 12 capsule 0   No facility-administered medications prior to visit.     Review of Systems:    Constitutional: No weight loss or gain, night sweats, fevers, chills, fatigue, or lassitude. HEENT: No headaches, difficulty swallowing, tooth/dental problems, or sore throat. No sneezing, itching, ear ache +nasal congestion, post nasal drip CV:  No chest pain, orthopnea, PND, swelling in lower extremities, anasarca, dizziness, palpitations, syncope Resp: + shortness of breath with exertion; chest tightness/wheeze; productive cough. No excess mucus or change in color of mucus. No hemoptysis. No chest wall deformity GI:  No heartburn, indigestion, abdominal pain, nausea, vomiting, diarrhea, change in bowel habits, loss of appetite, bloody stools.  GU: No dysuria, change in color of urine, urgency or frequency.  Skin: No rash, lesions, ulcerations MSK:  No joint pain or swelling.  Neuro: No dizziness or lightheadedness.  Psych: No depression or anxiety. Mood stable.     Physical Exam:  BP 120/86 (BP Location: Right Arm, Patient Position: Sitting, Cuff Size: Large)   Pulse 82   Ht 5\' 5"  (1.651 m)   Wt (!) 322 lb 12.8 oz (146.4 kg)   SpO2 100%   BMI 53.72 kg/m   GEN: Pleasant, interactive, well-appearing; morbidly obese; in no acute distress HEENT:  Normocephalic and atraumatic. PERRLA. Sclera  white. Nasal turbinates pink, moist and patent bilaterally. No rhinorrhea present. Oropharynx pink and moist, without exudate or edema. No lesions, ulcerations, or postnasal drip.  NECK:  Supple w/ fair ROM. No JVD present. Normal carotid impulses w/o bruits. Thyroid symmetrical with no goiter or nodules palpated. No lymphadenopathy.   CV: RRR, no m/r/g, no peripheral edema. Pulses intact, +2 bilaterally. No cyanosis, pallor or clubbing. PULMONARY:  Unlabored, regular breathing. Clear bilaterally A&P w/o wheezes/rales/rhonchi. No accessory muscle use.  GI: BS present and normoactive. Soft, non-tender to palpation. No organomegaly or masses detected. MSK: No erythema, warmth or tenderness. Cap refil  <2 sec all extrem. No deformities or joint swelling noted.  Neuro: A/Ox3. No focal deficits noted.   Skin: Warm, no lesions or rashe Psych: Normal affect and behavior. Judgement and thought content appropriate.     Lab Results:  CBC    Component Value Date/Time   WBC 3.3 (L) 11/22/2021 0910   WBC 3.6 (L) 12/12/2020 0926   RBC 4.32 11/22/2021 0910   RBC 4.53 12/12/2020 0926   HGB 12.4 11/22/2021 0910   HCT 36.4 11/22/2021 0910   PLT 245 11/22/2021 0910   MCV 84 11/22/2021 0910   MCH 28.7 11/22/2021 0910   MCH 28.5 07/19/2010 2200   MCHC 34.1 11/22/2021 0910   MCHC 33.8 12/12/2020 0926   RDW 13.2 11/22/2021 0910   LYMPHSABS 1.8 11/22/2021 0910   MONOABS 0.3 12/12/2020 0926   EOSABS 0.0 11/22/2021 0910   BASOSABS 0.0 11/22/2021 0910    BMET    Component Value Date/Time   NA 142 09/24/2022 0948   K 4.4 09/24/2022 0948   CL 103 09/24/2022 0948   CO2 25 09/24/2022 0948   GLUCOSE 106 (H) 09/24/2022 0948   GLUCOSE 108 (H) 07/19/2010 2200   BUN 14 09/24/2022 0948   CREATININE 0.80 09/24/2022 0948   CALCIUM 10.3 (H) 09/24/2022 0948   GFRNONAA >60 07/19/2010 2200   GFRAA >60 07/19/2010 2200    BNP No results found for: "BNP"   Imaging:  CT CHEST HIGH RESOLUTION Result Date: 02/18/2023 CLINICAL DATA:  53 year old female with history of shortness of breath and chest tightness. History of lupus. EXAM: CT CHEST WITHOUT CONTRAST TECHNIQUE: Multidetector CT imaging of the chest was performed following the standard protocol without intravenous contrast. High resolution imaging of the lungs, as well as inspiratory and expiratory imaging, was performed. RADIATION DOSE REDUCTION: This exam was performed according to the departmental dose-optimization program which includes automated exposure control, adjustment of the mA and/or kV according to patient size and/or use of iterative reconstruction technique. COMPARISON:  High-resolution chest CT 11/28/2021. FINDINGS: Cardiovascular:  Heart size is normal. There is no significant pericardial fluid, thickening or pericardial calcification. Aortic atherosclerosis. No definite coronary artery calcifications. Mediastinum/Nodes: No pathologically enlarged mediastinal or hilar lymph nodes. Please note that accurate exclusion of hilar adenopathy is limited on noncontrast CT scans. Esophagus is unremarkable in appearance. No axillary lymphadenopathy. Lungs/Pleura: High-resolution images demonstrate very mild ground-glass attenuation and septal thickening, most evident in the extreme right lung base. Findings appear essentially stable compared to the prior study. No generalized areas of ground-glass attenuation, septal thickening, subpleural reticulation, traction bronchiectasis or honeycombing are otherwise noted. Inspiratory and expiratory imaging demonstrates some mild air trapping indicative of mild small airways disease. No acute consolidative airspace disease. No pleural effusions. No definite suspicious appearing pulmonary nodules or masses are noted. Upper Abdomen: Mild diffuse low attenuation throughout the visualized hepatic parenchyma, indicative of a background of  hepatic steatosis. Musculoskeletal: There are no aggressive appearing lytic or blastic lesions noted in the visualized portions of the skeleton. IMPRESSION: 1. Stable findings of very mild fibrosis in the right lung base, stable dating back to 01/02/2021. The possibility of interstitial lung disease is not excluded, but not strongly favored on the basis of today's examination. Rather, an alternative diagnosis (likely mild postinfectious or inflammatory fibrosis) is favored. If there is persistent clinical concern for interstitial lung disease, repeat high-resolution chest CT could be considered in 12 months to ensure continued stability. 2. Mild air trapping indicative of mild small airways disease. 3. Aortic atherosclerosis. Aortic Atherosclerosis (ICD10-I70.0). Electronically  Signed   By: Trudie Reed M.D.   On: 02/18/2023 06:41    Administration History     None          Latest Ref Rng & Units 10/29/2022   12:58 PM 02/13/2021    9:55 AM  PFT Results  FVC-Pre L 3.05  3.34   FVC-Predicted Pre % 82  109   FVC-Post L 2.98  3.18   FVC-Predicted Post % 80  103   Pre FEV1/FVC % % 89  89   Post FEV1/FCV % % 90  92   FEV1-Pre L 2.71  2.98   FEV1-Predicted Pre % 93  121   FEV1-Post L 2.67  2.92   DLCO uncorrected ml/min/mmHg 19.61  20.95   DLCO UNC% % 89  94   DLCO corrected ml/min/mmHg 19.61  20.95   DLCO COR %Predicted % 89  94   DLVA Predicted % 103  105   TLC L 4.75  4.56   TLC % Predicted % 90  86   RV % Predicted % 68  60     No results found for: "NITRICOXIDE"      Assessment & Plan:   No problem-specific Assessment & Plan notes found for this encounter. 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 No evidence of interstitial lung disease or progressive pulmonary fibrosis. PFT normal. Can consider repeat HRCT in 12 months.  - Consider repeat CT scan in 12 months - Monitor symptoms and repeat PFT if symptoms worsen  Small Airways Disease/Possible Asthma CT scan shows air trapping in small airways, associated with conditions like asthma. No formal obstruction on PFTs. Elevated IgE suggests possible environmental allergies contributing to symptoms. She has received benefit from SABA use in the past. Will trial her on ICS/LABA with low dose Symbicort and reassess response. Educated on side effect profile including risk of inhaled steroids with thrush and rare lung infections, especially in immunocompromised individuals. Generally well tolerated. Teachback performed. Side effect profile reviewed. Action plan in place.  - Perform exhaled nitric oxide test to assess airway inflammation - 15 ppb  - Start Symbicort 80 mcg 2  puffs bid - Prescribe albuterol for PRN use - Educate on proper inhaler use and oral hygiene to prevent thrush - Recheck CBC with diff to assess peripheral eosinophil count  Environmental Allergies Elevated IgE levels and symptoms worsening during allergy season suggest allergic component. Symptoms include increased mucus production and nasal drainage. Discussed that Flonase nasal spray and daily Xyzal can help manage symptoms. Allergy testing may identify specific allergens, guiding further treatment.  - Start Flonase nasal spray, two sprays each nostril daily - Consider allergy testing to identify specific allergens - Advise taking Xyzal daily if allergy testing indicates significant  allergens  Obstructive Sleep Apnea (OSA) Uses CPAP nightly with a nasal mask. No recent follow-up data available. Discussed the importance of CPAP compliance. Understands proper care/use. Will request download from DME.  - Download and review CPAP data to assess compliance and efficacy  Systemic Lupus Erythematosus (SLE) Currently managed with Plaquenil. Recent discontinuation of Benlysta due to lack of efficacy.  - Update rheumatologist on current treatment and follow up as scheduled - No evidence of CT ILD on imaging    DOE Likely multifactorial related to obesity and deconditioning. Possible asthma component contributing. See above plan. Encouraged to continue working on healthy weight loss measures.   Follow-up - Follow up with rheumatologist regarding SLE management - Follow up in 6 weeks  - Monitor symptoms and return if respiratory symptoms worsen.        Advised if symptoms do not improve or worsen, to please contact office for sooner follow up or seek emergency care.   I spent 38 minutes of dedicated to the care of this patient on the date of this encounter to include pre-visit review of records, face-to-face time with the patient discussing conditions above, post visit ordering of testing,  clinical documentation with the electronic health record, making appropriate referrals as documented, and communicating necessary findings to members of the patients care team.  Noemi Chapel, NP 02/22/2023  Pt aware and understands NP's role.

## 2023-02-22 NOTE — Patient Instructions (Addendum)
-  Start Symbicort 2 puffs Twice daily. Brush tongue and rinse mouth afterwards to prevent thrush. Discussed in rare circumstances, this can increase risk for lung infections. This is your new maintenance inhaler that you will use daily regardless of symptoms.  -Albuterol inhaler 2 puffs every 6 hours as needed for shortness of breath or wheezing. This is for rescue/emergency use. You can also use it about 10-15 minutes before exercise if needed  -Continue xyzal 1 tab daily for allergies -Restart flonase 2 sprays each nostril Twice daily for sinus congestion/mucus   Your CT chest scan showed non-specific, mild changes that are unchanged from 2022 and likely post-infectious scarring. It does not appear to be interstitial lung disease, which is good news. You do have some mild air trapping, which you can see in people who have asthma which is why we are trying the inhaler Your breathing test today was normal   Continue CPAP nightly We discussed how untreated sleep apnea puts an individual at risk for cardiac arrhthymias, pulm HTN, DM, stroke and increases their risk for daytime accidents.   Check allergen panel and CBC today   Follow up in 6 weeks with Dr. Isaiah Booth or Julia Kaisyn Millea,NP. If symptoms do not improve or worsen, please contact office for sooner follow up or seek emergency care.

## 2023-02-25 LAB — ALLERGEN PANEL (27) + IGE
Alternaria Alternata IgE: <0.1 kU/L
Aspergillus Fumigatus IgE: <0.1 kU/L
Bahia Grass IgE: <0.1 kU/L
Bermuda Grass IgE: <0.1 kU/L
Cat Dander IgE: <0.1 kU/L
Cedar, Mountain IgE: <0.1 kU/L
Cladosporium Herbarum IgE: <0.1 kU/L
Cocklebur IgE: <0.1 kU/L
Cockroach, American IgE: <0.1 kU/L
Common Silver Birch IgE: <0.1 kU/L
D Farinae IgE: <0.1 kU/L
D Pteronyssinus IgE: <0.1 kU/L
Dog Dander IgE: <0.1 kU/L
Elm, American IgE: <0.1 kU/L
Hickory, White IgE: <0.1 kU/L
IgE (Immunoglobulin E), Serum: 167 [IU]/mL (ref 6–495)
Johnson Grass IgE: <0.1 kU/L
Kentucky Bluegrass IgE: <0.1 kU/L
Maple/Box Elder IgE: <0.1 kU/L
Mucor Racemosus IgE: <0.1 kU/L
Oak, White IgE: <0.1 kU/L
Penicillium Chrysogen IgE: <0.1 kU/L
Pigweed, Rough IgE: <0.1 kU/L
Plantain, English IgE: <0.1 kU/L
Ragweed, Short IgE: <0.1 kU/L
Setomelanomma Rostrat: <0.1 kU/L
Timothy Grass IgE: <0.1 kU/L
White Mulberry IgE: <0.1 kU/L

## 2023-03-04 ENCOUNTER — Ambulatory Visit (HOSPITAL_BASED_OUTPATIENT_CLINIC_OR_DEPARTMENT_OTHER): Payer: Managed Care, Other (non HMO) | Admitting: Family

## 2023-03-08 DIAGNOSIS — J309 Allergic rhinitis, unspecified: Secondary | ICD-10-CM

## 2023-03-12 NOTE — Telephone Encounter (Signed)
Ok to place allergy referral.  Can you see if she is in [place] now please? If not, call DME for download. Thanks.

## 2023-03-13 NOTE — Telephone Encounter (Signed)
 Allergy referral placed. Waiting on download. Have message Rodman Clam to add us  for viewing rights on [employer].

## 2023-04-11 ENCOUNTER — Encounter (INDEPENDENT_AMBULATORY_CARE_PROVIDER_SITE_OTHER): Payer: Self-pay

## 2023-05-13 ENCOUNTER — Other Ambulatory Visit: Payer: Self-pay | Admitting: Family Medicine

## 2023-05-13 DIAGNOSIS — Z1231 Encounter for screening mammogram for malignant neoplasm of breast: Secondary | ICD-10-CM

## 2023-05-21 ENCOUNTER — Ambulatory Visit (HOSPITAL_BASED_OUTPATIENT_CLINIC_OR_DEPARTMENT_OTHER): Payer: Managed Care, Other (non HMO) | Admitting: Family

## 2023-05-29 ENCOUNTER — Ambulatory Visit
Admission: RE | Admit: 2023-05-29 | Discharge: 2023-05-29 | Disposition: A | Discharge status: Home / Self Care | Source: Ambulatory Visit | Attending: Family Medicine | Admitting: Family Medicine

## 2023-05-29 DIAGNOSIS — Z1231 Encounter for screening mammogram for malignant neoplasm of breast: Secondary | ICD-10-CM

## 2023-06-04 ENCOUNTER — Encounter (INDEPENDENT_AMBULATORY_CARE_PROVIDER_SITE_OTHER): Payer: Self-pay | Admitting: Family Medicine

## 2023-06-04 ENCOUNTER — Ambulatory Visit (INDEPENDENT_AMBULATORY_CARE_PROVIDER_SITE_OTHER): Admitting: Family Medicine

## 2023-06-04 VITALS — BP 130/84 | HR 59 | Temp 98.1°F | Ht 65.0 in | Wt 318.0 lb

## 2023-06-04 DIAGNOSIS — E559 Vitamin D deficiency, unspecified: Secondary | ICD-10-CM | POA: Diagnosis not present

## 2023-06-04 DIAGNOSIS — R0609 Other forms of dyspnea: Secondary | ICD-10-CM

## 2023-06-04 DIAGNOSIS — I158 Other secondary hypertension: Secondary | ICD-10-CM

## 2023-06-04 DIAGNOSIS — R7303 Prediabetes: Secondary | ICD-10-CM

## 2023-06-04 DIAGNOSIS — E7849 Other hyperlipidemia: Secondary | ICD-10-CM

## 2023-06-04 DIAGNOSIS — E669 Obesity, unspecified: Secondary | ICD-10-CM

## 2023-06-04 DIAGNOSIS — I10 Essential (primary) hypertension: Secondary | ICD-10-CM

## 2023-06-04 DIAGNOSIS — E785 Hyperlipidemia, unspecified: Secondary | ICD-10-CM | POA: Diagnosis not present

## 2023-06-04 DIAGNOSIS — Z6841 Body Mass Index (BMI) 40.0 and over, adult: Secondary | ICD-10-CM

## 2023-06-04 NOTE — Progress Notes (Signed)
 Office: [phone]  /  Fax: [phone]  WEIGHT SUMMARY AND BIOMETRICS  Anthropometric Measurements Height: 5\' 5"  (1.651 m) Weight: (!) 318 lb (144.2 kg) BMI (Calculated): 52.92 Weight at Last Visit: 322lb Weight Lost Since Last Visit: 4lb Weight Gained Since Last Visit: 0 Starting Weight: 345lb Total Weight Loss (lbs): 27 lb (12.2 kg)   Body Composition  Body Fat %: 59.2 % Fat Mass (lbs): 188.8 lbs Muscle Mass (lbs): 123.4 lbs Visceral Fat Rating : 23   Other Clinical Data RMR: 1843 Fasting: yes Labs: yes Today's Visit #: 24 Starting Date: 11/22/21    Chief Complaint: OBESITY    History of Present Illness Julia Booth is a 53 year old female who presents to reestablish care for obesity treatment.  She last visited the office approximately five months ago and was prescribed a portion control smart choices eating plan. Her basal metabolic rate was calculated to be 1959, and her resting energy expenditure was measured at 1843, which has worsened from a previous measurement of 1987. She has lost 4 pounds since the holidays, now weighing 322 pounds. She continues to work on managing her weight through a caloric deficit while maintaining adequate protein intake.  She has a history of prediabetes and has been working on diet and exercise to improve it. Her most recent hemoglobin A1c, taken a year ago, was elevated at 5.9. She is due for updated labs to monitor her condition.  She has a history of hypertension and is currently on Coreg  12.5 mg twice daily. Her initial blood pressure today was elevated at 148/86, but a repeat measurement stabilized at 130/84. She continues to work on diet, exercise, and weight loss to manage her hypertension.  She is also managing hyperlipidemia with Crestor  and dietary changes. She is due for labs to check cholesterol levels.  She has a history of shortness of breath with exercise, which is stable. She also has a history of  lupus, pulmonary hypertension, and scarring in her lungs. She is working on weight loss to alleviate symptoms.  She recently moved to a new location and has been busy with work and family responsibilities.      PHYSICAL EXAM:  Blood pressure 130/84, pulse (!) 59, temperature 98.1 F (36.7 C), height 5\' 5"  (1.651 m), weight (!) 318 lb (144.2 kg), SpO2 98%. Body mass index is 52.92 kg/m.  DIAGNOSTIC DATA REVIEWED:  BMET    Component Value Date/Time   NA 142 09/24/2022 0948   K 4.4 09/24/2022 0948   CL 103 09/24/2022 0948   CO2 25 09/24/2022 0948   GLUCOSE 106 (H) 09/24/2022 0948   GLUCOSE 108 (H) 07/19/2010 2200   BUN 14 09/24/2022 0948   CREATININE 0.80 09/24/2022 0948   CALCIUM  10.3 (H) 09/24/2022 0948   GFRNONAA >60 07/19/2010 2200   GFRAA >60 07/19/2010 2200   Lab Results  Component Value Date   HGBA1C 5.9 (H) 05/09/2022   HGBA1C 6.0 (H) 05/08/2021   Lab Results  Component Value Date   INSULIN  11.6 05/09/2022   INSULIN  17.8 11/22/2021   Lab Results  Component Value Date   TSH 1.030 11/22/2021   CBC    Component Value Date/Time   WBC 3.3 (L) 02/22/2023 1132   RBC 4.48 02/22/2023 1132   HGB 12.8 02/22/2023 1132   HGB 12.4 11/22/2021 0910   HCT 38.2 02/22/2023 1132   HCT 36.4 11/22/2021 0910   PLT 265.0 02/22/2023 1132   PLT 245 11/22/2021 0910   MCV  85.4 02/22/2023 1132   MCV 84 11/22/2021 0910   MCH 28.7 11/22/2021 0910   MCH 28.5 07/19/2010 2200   MCHC 33.6 02/22/2023 1132   RDW 14.9 02/22/2023 1132   RDW 13.2 11/22/2021 0910   Iron Studies No results found for: "IRON", "TIBC", "FERRITIN", "IRONPCTSAT" Lipid Panel     Component Value Date/Time   CHOL 123 05/09/2022 1306   TRIG 51 05/09/2022 1306   HDL 58 05/09/2022 1306   CHOLHDL 2.1 08/11/2021 1640   LDLCALC 53 05/09/2022 1306   Hepatic Function Panel     Component Value Date/Time   PROT 7.2 05/09/2022 1306   ALBUMIN 4.4 05/09/2022 1306   AST 29 05/09/2022 1306   ALT 22 05/09/2022  1306   ALKPHOS 122 (H) 05/09/2022 1306   BILITOT 0.3 05/09/2022 1306      Component Value Date/Time   TSH 1.030 11/22/2021 0910   Nutritional Lab Results  Component Value Date   VD25OH 34.2 05/09/2022   VD25OH 25.0 (L) 11/22/2021     Assessment and Plan Assessment & Plan Obesity She has lost 4 pounds since the holidays, indicating a steady decline in weight. Resting energy expenditure has decreased from 1987 to 1843 calories, necessitating an adjustment in caloric intake. Emphasized maintaining a caloric intake between 1300 to 1500 calories per day, with a minimum of 90 grams of protein daily to prevent metabolic slowdown. Discussed dietary management options, including journaling versus a structured eating plan. She prefers journaling for flexibility, especially with travel. Discussed potential use of medications like Zepbound  or Wegovy  for weight management, pending evaluation of hunger levels. - Maintain caloric intake between 1300 to 1500 calories per day. - Ensure intake of at least 90 grams of protein daily. - Encourage journaling of dietary intake. - Evaluate hunger levels and type (physiologic vs. neurologic) over the next 2-4 weeks. - Consider medication options for weight management after evaluating hunger assessment.  Dyspnea on exertion Dyspnea on exertion is being addressed through weight loss and exercise. - Continue current management with diet and exercise.  Prediabetes Prediabetes with previous hemoglobin A1c of 5.9% a year ago. She is working on diet and exercise to improve condition. Labs are due for repeat testing to assess current status. - Order labs to check hemoglobin A1c and other relevant parameters.  Hyperlipidemia Hyperlipidemia is being managed with Crestor  and dietary modifications. Labs are due for repeat testing to assess current status. - Order labs to check cholesterol levels.  Vitamin D  deficiency Vitamin D  deficiency requires repeat testing  to assess current status. - Order labs to check vitamin D  levels.  HTN Elevated today, improved with second reading [facility] labs -Continue diet, exercise and weight loss.    I have personally spent 52 minutes total time today in preparation, patient care, and documentation for this visit, including the following: customized nutritional counseling and review of testing today including reasons for her REE to have decreased and strategies to improve this as per the above note.   She was informed of the importance of frequent follow up visits to maximize her success with intensive lifestyle modifications for her multiple health conditions.    Jasmine Mesi, MD

## 2023-06-05 LAB — CBC WITH DIFFERENTIAL/PLATELET
Basophils Absolute: 0 10*3/uL (ref 0.0–0.2)
Basos: 0 %
EOS (ABSOLUTE): 0 10*3/uL (ref 0.0–0.4)
Eos: 0 %
Hematocrit: 35.8 % (ref 34.0–46.6)
Hemoglobin: 11.8 g/dL (ref 11.1–15.9)
Immature Grans (Abs): 0 10*3/uL (ref 0.0–0.1)
Immature Granulocytes: 0 %
Lymphocytes Absolute: 1 10*3/uL (ref 0.7–3.1)
Lymphs: 28 %
MCH: 28.2 pg (ref 26.6–33.0)
MCHC: 33 g/dL (ref 31.5–35.7)
MCV: 86 fL (ref 79–97)
Monocytes Absolute: 0.2 10*3/uL (ref 0.1–0.9)
Monocytes: 6 %
Neutrophils Absolute: 2.3 10*3/uL (ref 1.4–7.0)
Neutrophils: 66 %
Platelets: 267 10*3/uL (ref 150–450)
RBC: 4.18 x10E6/uL (ref 3.77–5.28)
RDW: 12.2 % (ref 11.7–15.4)
WBC: 3.5 10*3/uL (ref 3.4–10.8)

## 2023-06-05 LAB — HEMOGLOBIN A1C
Est. average glucose Bld gHb Est-mCnc: 123 mg/dL
Hgb A1c MFr Bld: 5.9 % — ABNORMAL HIGH (ref 4.8–5.6)

## 2023-06-05 LAB — CMP14+EGFR
ALT: 31 IU/L (ref 0–32)
AST: 24 IU/L (ref 0–40)
Albumin: 4.1 g/dL (ref 3.8–4.9)
Alkaline Phosphatase: 142 IU/L — ABNORMAL HIGH (ref 44–121)
BUN/Creatinine Ratio: 22 (ref 9–23)
BUN: 14 mg/dL (ref 6–24)
Bilirubin Total: 0.3 mg/dL (ref 0.0–1.2)
CO2: 24 mmol/L (ref 20–29)
Calcium: 9.5 mg/dL (ref 8.7–10.2)
Chloride: 105 mmol/L (ref 96–106)
Creatinine, Ser: 0.65 mg/dL (ref 0.57–1.00)
Globulin, Total: 2.9 g/dL (ref 1.5–4.5)
Glucose: 99 mg/dL (ref 70–99)
Potassium: 4.6 mmol/L (ref 3.5–5.2)
Sodium: 144 mmol/L (ref 134–144)
Total Protein: 7 g/dL (ref 6.0–8.5)
eGFR: 105 mL/min/{1.73_m2} (ref >59)

## 2023-06-05 LAB — LIPID PANEL WITH LDL/HDL RATIO
Cholesterol, Total: 173 mg/dL (ref 100–199)
HDL: 66 mg/dL (ref >39)
LDL Chol Calc (NIH): 99 mg/dL (ref 0–99)
LDL/HDL Ratio: 1.5 ratio (ref 0.0–3.2)
Triglycerides: 36 mg/dL (ref 0–149)
VLDL Cholesterol Cal: 8 mg/dL (ref 5–40)

## 2023-06-05 LAB — VITAMIN B12: Vitamin B-12: 370 pg/mL (ref 232–1245)

## 2023-06-05 LAB — VITAMIN D 25 HYDROXY (VIT D DEFICIENCY, FRACTURES): Vit D, 25-Hydroxy: 22.8 ng/mL — ABNORMAL LOW (ref 30.0–100.0)

## 2023-06-05 LAB — INSULIN, RANDOM: INSULIN: 15.4 u[IU]/mL (ref 2.6–24.9)

## 2023-06-06 ENCOUNTER — Ambulatory Visit: Admitting: Podiatry

## 2023-06-06 DIAGNOSIS — M7662 Achilles tendinitis, left leg: Secondary | ICD-10-CM

## 2023-06-06 MED ORDER — MELOXICAM 15 MG PO TABS: 15.0000 mg | ORAL_TABLET | Freq: Every day | ORAL | 0 refills | Status: DC

## 2023-06-08 ENCOUNTER — Other Ambulatory Visit (HOSPITAL_BASED_OUTPATIENT_CLINIC_OR_DEPARTMENT_OTHER): Payer: Self-pay | Admitting: Family

## 2023-06-08 ENCOUNTER — Other Ambulatory Visit: Payer: Self-pay | Admitting: Podiatry

## 2023-06-12 NOTE — Progress Notes (Signed)
 Subjective:  Patient ID: Julia Booth, female    DOB: Sep 25, 1970,  MRN: [id number]  Chief Complaint  Patient presents with   Foot Pain    Left foot heel pain going up towards the back of the leg     53 y.o. female presents with the above complaint.  Patient presents with left Achilles tendinitis insertional pain hurts with ambulation worse with pressure she states it just came out of nowhere is going up the leg will get it evaluated has not seen anyone else prior to seeing me pain scale is 5 out of 10 dull achy in nature.   Review of Systems: Negative except as noted in the HPI. Denies N/V/F/Ch.  Past Medical History:  Diagnosis Date   Aortic atherosclerosis (HCC) 05/08/2021   Essential hypertension 01/05/2021   Exertional dyspnea 01/05/2021   Knee pain    Lupus    OSA (obstructive sleep apnea) 01/05/2021   Pre-diabetes    Pulmonary hypertension, unspecified (HCC) 01/05/2021    Current Outpatient Medications:    meloxicam  (MOBIC ) 15 MG tablet, Take 1 tablet (15 mg total) by mouth daily., Disp: 30 tablet, Rfl: 0   albuterol  (VENTOLIN  HFA) 108 (90 Base) MCG/ACT inhaler, Inhale 2 puffs into the lungs every 6 (six) hours as needed for wheezing or shortness of breath., Disp: 8 g, Rfl: 2   azaTHIOprine (IMURAN) 50 MG tablet, Take 1 tablet Orally daily for 30 days, Disp: , Rfl:    BENLYSTA 200 MG/ML SOAJ, Inject 1 Syringe into the skin once a week., Disp: , Rfl:    budesonide -formoterol  (SYMBICORT ) 80-4.5 MCG/ACT inhaler, Inhale 2 puffs into the lungs in the morning and at bedtime., Disp: 1 each, Rfl: 12   carvedilol  (COREG ) 12.5 MG tablet, TAKE 1 TABLET BY MOUTH 2 TIMES DAILY., Disp: 180 tablet, Rfl: 2   furosemide  (LASIX ) 40 MG tablet, Take 2 tablets (80mg ) on Mondays, Wednesdays, Friday and 1 tablet (40mg ) every other day., Disp: 135 tablet, Rfl: 3   hydroxychloroquine (PLAQUENIL) 200 MG tablet, Take 200 mg by mouth 2 (two) times daily., Disp: , Rfl:    levocetirizine (XYZAL  ALLERGY 24HR) 5 MG tablet, , Disp: , Rfl:    omeprazole (PRILOSEC) 20 MG capsule, Take 20 mg by mouth every morning., Disp: , Rfl:    rosuvastatin  (CRESTOR ) 10 MG tablet, TAKE 1 TABLET BY MOUTH EVERY DAY, Disp: 90 tablet, Rfl: 1   Vitamin D , Ergocalciferol , (DRISDOL ) 1.25 MG (50000 UNIT) CAPS capsule, Take 1 capsule (50,000 Units total) by mouth every 7 (seven) days., Disp: 12 capsule, Rfl: 0  Social History   Tobacco Use  Smoking Status Former   Types: Cigarettes  Smokeless Tobacco Never  Tobacco Comments   Smokes with stress- 1 time a month average    Allergies  Allergen Reactions   Penicillins Hives, Other (See Comments) and Rash    NATURAL PENICILLINS:  - Converted from [employer] NATURAL PENICILLINS:  - Converted from [employer]    Sulfa Antibiotics Rash    Other reaction(s): Other (See Comments) Caused flare of her Lupus SULFONAMIDES:  - Converted from [employer] Lupus flare up SULFONAMIDES:  - Converted from [employer] Lupus flare up    Amoxicillin-Pot Clavulanate Rash   Objective:  There were no vitals filed for this visit. There is no height or weight on file to calculate BMI. Constitutional Well developed. Well nourished.  Vascular Dorsalis pedis pulses palpable bilaterally. Posterior tibial pulses palpable bilaterally. Capillary refill normal to all digits.  No cyanosis or clubbing  noted. Pedal hair growth normal.  Neurologic Normal speech. Oriented to person, place, and time. Epicritic sensation to light touch grossly present bilaterally.  Dermatologic Nails well groomed and normal in appearance. No open wounds. No skin lesions.  Orthopedic: Pain on palpation left Achilles tendon insertional pain.  Positive Haglund's deformity noted.  Positive Silfverskiold test noted with gastrocnemius equinus.  No pain at the peroneal tendon posterior tibial tendon ATFL ligament pain with dorsiflexion of the ankle joint no pain with plantarflexion of the ankle joint    Radiographs: None Assessment:   1. Left Achilles tendinitis    Plan:  Patient was evaluated and treated and all questions answered.  Left Achilles tendinitis - All questions and concerns were discussed with the patient in extensive detail given the amount of pain that she is experiencing she would benefit from cam boot immobilization and allow the soft tissue structure to heal appropriately.  Patient agrees with plan elected proceed with cam boot immobilization - Cam boot was dispensed if there is no improvement we will discuss steroid injection versus MRI  No follow-ups on file.

## 2023-07-02 ENCOUNTER — Ambulatory Visit (HOSPITAL_BASED_OUTPATIENT_CLINIC_OR_DEPARTMENT_OTHER): Admitting: Family

## 2023-07-05 ENCOUNTER — Telehealth (HOSPITAL_BASED_OUTPATIENT_CLINIC_OR_DEPARTMENT_OTHER): Payer: Self-pay | Admitting: Cardiovascular Disease

## 2023-07-05 MED ORDER — CARVEDILOL 12.5 MG PO TABS: 12.5000 mg | ORAL_TABLET | Freq: Two times a day (BID) | ORAL | 1 refills | Status: DC

## 2023-07-05 NOTE — Telephone Encounter (Signed)
 Refill sent to preferred pharmacy.

## 2023-07-05 NOTE — Telephone Encounter (Signed)
 Pt c/o medication issue:  1. Name of Medication:   carvedilol  (COREG ) 12.5 MG tablet   2. How are you currently taking this medication (dosage and times per day)?   As prescribed  3. Are you having a reaction (difficulty breathing--STAT)?   No  4. What is your medication issue?   Patient stated she wants an updated prescription sent to [facility] [address] [place] - [address] AT [facility] showing her prescription should be for 12.5 mg not 6.25 mg.

## 2023-07-09 ENCOUNTER — Ambulatory Visit (INDEPENDENT_AMBULATORY_CARE_PROVIDER_SITE_OTHER): Admitting: Adult Health

## 2023-07-09 ENCOUNTER — Telehealth (INDEPENDENT_AMBULATORY_CARE_PROVIDER_SITE_OTHER): Payer: Self-pay

## 2023-07-09 ENCOUNTER — Encounter (INDEPENDENT_AMBULATORY_CARE_PROVIDER_SITE_OTHER): Payer: Self-pay | Admitting: Adult Health

## 2023-07-09 ENCOUNTER — Telehealth (INDEPENDENT_AMBULATORY_CARE_PROVIDER_SITE_OTHER): Payer: Self-pay | Admitting: *Deleted

## 2023-07-09 VITALS — BP 122/76 | HR 77 | Temp 98.4°F | Ht 65.0 in | Wt 323.0 lb

## 2023-07-09 DIAGNOSIS — E669 Obesity, unspecified: Secondary | ICD-10-CM

## 2023-07-09 DIAGNOSIS — R0609 Other forms of dyspnea: Secondary | ICD-10-CM | POA: Diagnosis not present

## 2023-07-09 DIAGNOSIS — Z6841 Body Mass Index (BMI) 40.0 and over, adult: Secondary | ICD-10-CM

## 2023-07-09 DIAGNOSIS — E559 Vitamin D deficiency, unspecified: Secondary | ICD-10-CM | POA: Diagnosis not present

## 2023-07-09 DIAGNOSIS — E7849 Other hyperlipidemia: Secondary | ICD-10-CM | POA: Diagnosis not present

## 2023-07-09 DIAGNOSIS — R7303 Prediabetes: Secondary | ICD-10-CM

## 2023-07-09 MED ORDER — ZEPBOUND 2.5 MG/0.5ML ~~LOC~~ SOAJ: 2.5000 mg | SUBCUTANEOUS | 0 refills | Status: DC

## 2023-07-09 NOTE — Telephone Encounter (Signed)
 PA questions for Zepbound 2.5  have been answered and all documentation has been included. Waiting on a determination.

## 2023-07-09 NOTE — Telephone Encounter (Signed)
 Prior authorization has been sent to health plan for approval for Zepbound -2.5 mg/0.5ML pen,waiting for response.

## 2023-07-09 NOTE — Progress Notes (Addendum)
 WEIGHT SUMMARY AND BIOMETRICS  Vitals Temp: 98.4 F (36.9 C) BP: 122/76 Pulse Rate: 77 SpO2: 100 %   Anthropometric Measurements Height: 5' 5 (1.651 m) Weight: (!) 323 lb (146.5 kg) BMI (Calculated): 53.75 Weight at Last Visit: 318 lb Weight Lost Since Last Visit: 0 Weight Gained Since Last Visit: 5 lb Starting Weight: 345 lb Total Weight Loss (lbs): 22 lb (9.979 kg)   Body Composition  Body Fat %: 58.9 % Fat Mass (lbs): 190.8 lbs Muscle Mass (lbs): 126.2 lbs Visceral Fat Rating : 23   Other Clinical Data RMR: [id number] Fasting: no Labs: no Today's Visit #: 25 Starting Date: 11/22/21    Chief Complaint:   OBESITY Julia Booth is here to discuss her progress with her obesity treatment plan.  She is on the keeping a food journal and adhering to recommended goals of 1300-1500 calories and 90g protein and states she is following her eating plan approximately 60-70 % of the time.  She states she is exercising Walking 15 minutes 3 times per week.  Interim History:  Reviewed Bioimpedance results with pt: Muscle Mass: +2.8 lbs Adipose Mass: + 2 lbs  She is currently in a cam boot for L achilles tendonitis/plantar fascitis She is treating sx's with rest and PRN Mobic   She would like to dicuss starting GIP/GLP-1 therapy today   Of Note- she is post menapausal  Subjective:   1. Prediabetes Discussed Labs  Latest Reference Range & Units 06/04/23 11:05  Glucose 70 - 99 mg/dL 99  Hemoglobin J8R 4.8 - 5.6 % 5.9 (H)  Est. average glucose Bld gHb Est-mCnc mg/dL 876  INSULIN  2.6 - 24.9 uIU/mL 15.4  (H): Data is abnormally high  CBG at goal A1c in prediabetic range Insulin  level above goal of 5  Discussed risks benefit of Metformin /Wegovy /Zepbound  therapy She prefers the ease and effectiveness of a weekly injection  She denies family hx of MENS2 or MTC She denies personal hx of pancreatitis She is post meanopausal   2. DOE (dyspnea on exertion) Discussed  Labs 06/04/2023 CMP:  H/H stable  3. Vitamin D  deficiency Discussed Labs  Latest Reference Range & Units 06/04/23 11:05  Vitamin D , 25-Hydroxy 30.0 - 100.0 ng/mL 22.8 (L)  (L): Data is abnormally low  Vit D well below goal of 50-70 She has been inconsistently taking weekly Ergocalciferol   4. Other hyperlipidemia Discussed Labs Lipid Panel     Component Value Date/Time   CHOL 173 06/04/2023 1105   TRIG 36 06/04/2023 1105   HDL 66 06/04/2023 1105   CHOLHDL 2.1 08/11/2021 1640   LDLCALC 99 06/04/2023 1105   LABVLDL 8 06/04/2023 1105   The 10-year ASCVD risk score (Arnett DK, et al., 2019) is: 2.6%   Values used to calculate the score:     Age: 53 years     Sex: Female     Is Non-Hispanic African American: Yes     Diabetic: No     Tobacco smoker: No     Systolic Blood Pressure: 122 mmHg     Is BP treated: Yes     HDL Cholesterol: 66 mg/dL     Total Cholesterol: 173 mg/dL   Lipid panel stable and at goal She is on daily Crestor  10mg   Assessment/Plan:   1. Prediabetes (Primary) Continue healthy eating and regular exercise  2. DOE (dyspnea on exertion) Continue healthy eating and regular exercise Monitor for respiratory sx's  3. Vitamin D  deficiency Continue weekly Ergocalciferol - consistently Start OTC MVI  4. Other hyperlipidemia Continue healthy eating and regular exercise  5. Obesity,current BMI 53.9 Start tirzepatide  (ZEPBOUND ) 2.5 MG/0.5ML Pen Inject 2.5 mg into the skin once a week. Dispense: 3 mL, Refills: 0 ordered   Julia Booth is currently in the action stage of change. As such, her goal is to continue with weight loss efforts. She has agreed to keeping a food journal and adhering to recommended goals of 1300-1500 calories and 60-70 protein.   Exercise goals: All adults should avoid inactivity. Some physical activity is better than none, and adults who participate in any amount of physical activity gain some health benefits. Adults should also include  muscle-strengthening activities that involve all major muscle groups on 2 or more days a week.  Behavioral modification strategies: increasing lean protein intake, decreasing simple carbohydrates, increasing vegetables, increasing water intake, meal planning and cooking strategies, keeping healthy foods in the home, ways to avoid boredom eating, and planning for success.  Julia Booth has agreed to follow-up with our clinic in 4 weeks. She was informed of the importance of frequent follow-up visits to maximize her success with intensive lifestyle modifications for her multiple health conditions.   Objective:   Blood pressure 122/76, pulse 77, temperature 98.4 F (36.9 C), height 5' 5 (1.651 m), weight (!) 323 lb (146.5 kg), SpO2 100%. Body mass index is 53.75 kg/m.  General: Cooperative, alert, well developed, in no acute distress. HEENT: Conjunctivae and lids unremarkable. Cardiovascular: Regular rhythm.  Lungs: Normal work of breathing. Neurologic: No focal deficits.   Lab Results  Component Value Date   CREATININE 0.65 06/04/2023   BUN 14 06/04/2023   NA 144 06/04/2023   K 4.6 06/04/2023   CL 105 06/04/2023   CO2 24 06/04/2023   Lab Results  Component Value Date   ALT 31 06/04/2023   AST 24 06/04/2023   ALKPHOS 142 (H) 06/04/2023   BILITOT 0.3 06/04/2023   Lab Results  Component Value Date   HGBA1C 5.9 (H) 06/04/2023   HGBA1C 5.9 (H) 05/09/2022   HGBA1C 5.9 (H) 11/22/2021   HGBA1C 6.0 (H) 05/08/2021   Lab Results  Component Value Date   INSULIN  15.4 06/04/2023   INSULIN  11.6 05/09/2022   INSULIN  17.8 11/22/2021   Lab Results  Component Value Date   TSH 1.030 11/22/2021   Lab Results  Component Value Date   CHOL 173 06/04/2023   HDL 66 06/04/2023   LDLCALC 99 06/04/2023   TRIG 36 06/04/2023   CHOLHDL 2.1 08/11/2021   Lab Results  Component Value Date   VD25OH 22.8 (L) 06/04/2023   VD25OH 34.2 05/09/2022   VD25OH 25.0 (L) 11/22/2021   Lab Results   Component Value Date   WBC 3.5 06/04/2023   HGB 11.8 06/04/2023   HCT 35.8 06/04/2023   MCV 86 06/04/2023   PLT 267 06/04/2023   No results found for: IRON, TIBC, FERRITIN  Attestation Statements:   Reviewed by clinician on day of visit: allergies, medications, problem list, medical history, surgical history, family history, social history, and previous encounter notes.  I have reviewed the above documentation for accuracy and completeness, and I agree with the above. -  Laporsche Hoeger d. Laura-Lee Villegas, NP-C

## 2023-07-10 NOTE — Telephone Encounter (Signed)
 PA for Zepbound  2.5 has been approved. PA is now complete.      Message from Plan CaseId:[id number];Status:Approved;Review Type:Prior Auth;Coverage Start Date:07/09/2023;Coverage End Date:07/09/2024;. Authorization Expiration Date: July 09, 2024.

## 2023-07-11 ENCOUNTER — Ambulatory Visit: Admitting: Podiatry

## 2023-07-11 DIAGNOSIS — M9262 Juvenile osteochondrosis of tarsus, left ankle: Secondary | ICD-10-CM | POA: Diagnosis not present

## 2023-07-11 DIAGNOSIS — M7662 Achilles tendinitis, left leg: Secondary | ICD-10-CM | POA: Diagnosis not present

## 2023-07-11 NOTE — Progress Notes (Signed)
 Subjective:  Patient ID: Julia Booth, female    DOB: 01-25-1971,  MRN: [id number]  Chief Complaint  Patient presents with   Foot Pain    Left achilles pain follow up     53 y.o. female presents with the above complaint.  Patient presents with left Achilles tendinitis insertional pain hurts with ambulation worse with pressure she states it just came out of nowhere is going up the leg will get it evaluated has not seen anyone else prior to seeing me pain scale is 5 out of 10 dull achy in nature.   Review of Systems: Negative except as noted in the HPI. Denies N/V/F/Ch.  Past Medical History:  Diagnosis Date   Aortic atherosclerosis (HCC) 05/08/2021   Essential hypertension 01/05/2021   Exertional dyspnea 01/05/2021   Knee pain    Lupus    OSA (obstructive sleep apnea) 01/05/2021   Pre-diabetes    Pulmonary hypertension, unspecified (HCC) 01/05/2021    Current Outpatient Medications:    acetaminophen (TYLENOL) 500 MG tablet, 1 tablet as needed Orally every 6 hrs, Disp: , Rfl:    albuterol  (VENTOLIN  HFA) 108 (90 Base) MCG/ACT inhaler, Inhale 2 puffs into the lungs every 6 (six) hours as needed for wheezing or shortness of breath., Disp: 8 g, Rfl: 2   azaTHIOprine (IMURAN) 50 MG tablet, Take 1 tablet Orally daily for 30 days, Disp: , Rfl:    budesonide -formoterol  (SYMBICORT ) 80-4.5 MCG/ACT inhaler, Inhale 2 puffs into the lungs in the morning and at bedtime., Disp: 1 each, Rfl: 12   carvedilol  (COREG ) 12.5 MG tablet, Take 1 tablet (12.5 mg total) by mouth 2 (two) times daily., Disp: 180 tablet, Rfl: 1   cholecalciferol (VITAMIN D3) 25 MCG (1000 UNIT) tablet, 1 tablet Orally Once a day, Disp: , Rfl:    furosemide  (LASIX ) 40 MG tablet, Take 2 tablets (80mg ) on Mondays, Wednesdays, Friday and 1 tablet (40mg ) every other day., Disp: 135 tablet, Rfl: 3   hydroxychloroquine (PLAQUENIL) 200 MG tablet, Take 200 mg by mouth 2 (two) times daily., Disp: , Rfl:    ibuprofen (ADVIL) 800 MG  tablet, 1 tablet with food or milk as needed Orally every 8 hrs, Disp: , Rfl:    levocetirizine (XYZAL ALLERGY 24HR) 5 MG tablet, , Disp: , Rfl:    Magnesium 300 MG CAPS, 1 capsule with a meal Orally Once a day, Disp: , Rfl:    Omega-3 Fatty Acids (FISH OIL) 1000 MG CAPS, 1 capsule., Disp: , Rfl:    omeprazole (PRILOSEC OTC) 20 MG tablet, 1 tablet 30 minutes before morning meal Orally Once a day for 30 day(s), Disp: , Rfl:    rosuvastatin  (CRESTOR ) 10 MG tablet, TAKE 1 TABLET BY MOUTH EVERY DAY, Disp: 90 tablet, Rfl: 1   tirzepatide  (ZEPBOUND ) 2.5 MG/0.5ML Pen, Inject 2.5 mg into the skin once a week., Disp: 3 mL, Rfl: 0   Vitamin D , Ergocalciferol , (DRISDOL ) 1.25 MG (50000 UNIT) CAPS capsule, Take 1 capsule (50,000 Units total) by mouth every 7 (seven) days., Disp: 12 capsule, Rfl: 0  Social History   Tobacco Use  Smoking Status Former   Types: Cigarettes  Smokeless Tobacco Never  Tobacco Comments   Smokes with stress- 1 time a month average    Allergies  Allergen Reactions   Penicillins Hives, Other (See Comments) and Rash    NATURAL PENICILLINS:  - Converted from [employer] NATURAL PENICILLINS:  - Converted from [employer]    Sulfa Antibiotics Rash    Other reaction(s):  Other (See Comments) Caused flare of her Lupus SULFONAMIDES:  - Converted from [employer] Lupus flare up SULFONAMIDES:  - Converted from [employer] Lupus flare up    Amoxicillin-Pot Clavulanate Rash   Belimumab     Other Reaction(s): Depression   Objective:  There were no vitals filed for this visit. There is no height or weight on file to calculate BMI. Constitutional Well developed. Well nourished.  Vascular Dorsalis pedis pulses palpable bilaterally. Posterior tibial pulses palpable bilaterally. Capillary refill normal to all digits.  No cyanosis or clubbing noted. Pedal hair growth normal.  Neurologic Normal speech. Oriented to person, place, and time. Epicritic sensation to light touch grossly  present bilaterally.  Dermatologic Nails well groomed and normal in appearance. No open wounds. No skin lesions.  Orthopedic: Pain on palpation left Achilles tendon insertional pain.  Positive Haglund's deformity noted.  Positive Silfverskiold test noted with gastrocnemius equinus.  No pain at the peroneal tendon posterior tibial tendon ATFL ligament pain with dorsiflexion of the ankle joint no pain with plantarflexion of the ankle joint   Radiographs: None Assessment:   1. Left Achilles tendinitis   2. Haglund's deformity, left     Plan:  Patient was evaluated and treated and all questions answered.  Left Achilles tendinitisWith underlying Haglund's deformity - All questions and concerns were discussed with the patient in extensive detail given the amount of pain that has reduced with cam boot with immobilization she still has some residual pain patient will benefit from a steroid injection I discussed the risk of rupture associated with this she states understand like to proceed despite the risks.  Patient would like to proceed with injection -A steroid injection was performed at left Kager's fat pad using 1% plain Lidocaine and 10 mg of Kenalog. This was well tolerated. - Tri-Lock ankle brace was dispensed to help with the transition out of the cam boot   No follow-ups on file.

## 2023-08-15 ENCOUNTER — Ambulatory Visit: Admitting: Podiatry

## 2023-08-15 DIAGNOSIS — M7662 Achilles tendinitis, left leg: Secondary | ICD-10-CM

## 2023-08-15 NOTE — Progress Notes (Signed)
 Subjective:  Patient ID: Julia Booth, female    DOB: [birth year],  MRN: [id number]  Chief Complaint  Patient presents with   Foot Pain    53 y.o. female presents with the above complaint.  Patient presents with complaint of left Achilles tendinitis and insertional pain.  She states it still hurts.  Would like to discuss next treatment plan.  Denies any other acute complaints   Review of Systems: Negative except as noted in the HPI. Denies N/V/F/Ch.  Past Medical History:  Diagnosis Date   Aortic atherosclerosis (HCC) 05/08/2021   Essential hypertension 01/05/2021   Exertional dyspnea 01/05/2021   Knee pain    Lupus    OSA (obstructive sleep apnea) 01/05/2021   Pre-diabetes    Pulmonary hypertension, unspecified (HCC) 01/05/2021    Current Outpatient Medications:    acetaminophen (TYLENOL) 500 MG tablet, 1 tablet as needed Orally every 6 hrs, Disp: , Rfl:    albuterol  (VENTOLIN  HFA) 108 (90 Base) MCG/ACT inhaler, Inhale 2 puffs into the lungs every 6 (six) hours as needed for wheezing or shortness of breath., Disp: 8 g, Rfl: 2   azaTHIOprine (IMURAN) 50 MG tablet, Take 1 tablet Orally daily for 30 days, Disp: , Rfl:    budesonide -formoterol  (SYMBICORT ) 80-4.5 MCG/ACT inhaler, Inhale 2 puffs into the lungs in the morning and at bedtime., Disp: 1 each, Rfl: 12   carvedilol  (COREG ) 12.5 MG tablet, Take 1 tablet (12.5 mg total) by mouth 2 (two) times daily., Disp: 180 tablet, Rfl: 1   cholecalciferol (VITAMIN D3) 25 MCG (1000 UNIT) tablet, 1 tablet Orally Once a day, Disp: , Rfl:    furosemide  (LASIX ) 40 MG tablet, Take 2 tablets (80mg ) on Mondays, Wednesdays, Friday and 1 tablet (40mg ) every other day., Disp: 135 tablet, Rfl: 3   hydroxychloroquine (PLAQUENIL) 200 MG tablet, Take 200 mg by mouth 2 (two) times daily., Disp: , Rfl:    ibuprofen (ADVIL) 800 MG tablet, 1 tablet with food or milk as needed Orally every 8 hrs, Disp: , Rfl:    levocetirizine (XYZAL ALLERGY 24HR) 5 MG  tablet, , Disp: , Rfl:    Magnesium 300 MG CAPS, 1 capsule with a meal Orally Once a day, Disp: , Rfl:    Omega-3 Fatty Acids (FISH OIL) 1000 MG CAPS, 1 capsule., Disp: , Rfl:    omeprazole (PRILOSEC OTC) 20 MG tablet, 1 tablet 30 minutes before morning meal Orally Once a day for 30 day(s), Disp: , Rfl:    rosuvastatin  (CRESTOR ) 10 MG tablet, TAKE 1 TABLET BY MOUTH EVERY DAY, Disp: 90 tablet, Rfl: 1   tirzepatide  (ZEPBOUND ) 2.5 MG/0.5ML Pen, Inject 2.5 mg into the skin once a week., Disp: 3 mL, Rfl: 0   Vitamin D , Ergocalciferol , (DRISDOL ) 1.25 MG (50000 UNIT) CAPS capsule, Take 1 capsule (50,000 Units total) by mouth every 7 (seven) days., Disp: 12 capsule, Rfl: 0  Social History   Tobacco Use  Smoking Status Former   Types: Cigarettes  Smokeless Tobacco Never  Tobacco Comments   Smokes with stress- 1 time a month average    Allergies  Allergen Reactions   Penicillins Hives, Other (See Comments) and Rash    NATURAL PENICILLINS:  - Converted from [employer] NATURAL PENICILLINS:  - Converted from [employer]    Sulfa Antibiotics Rash    Other reaction(s): Other (See Comments) Caused flare of her Lupus SULFONAMIDES:  - Converted from [employer] Lupus flare up SULFONAMIDES:  - Converted from [employer] Lupus flare up  Amoxicillin-Pot Clavulanate Rash   Belimumab     Other Reaction(s): Depression   Objective:  There were no vitals filed for this visit. There is no height or weight on file to calculate BMI. Constitutional Well developed. Well nourished.  Vascular Dorsalis pedis pulses palpable bilaterally. Posterior tibial pulses palpable bilaterally. Capillary refill normal to all digits.  No cyanosis or clubbing noted. Pedal hair growth normal.  Neurologic Normal speech. Oriented to person, place, and time. Epicritic sensation to light touch grossly present bilaterally.  Dermatologic Nails well groomed and normal in appearance. No open wounds. No skin lesions.   Orthopedic: Pain on palpation left Achilles tendon insertional pain.  Positive Haglund's deformity noted.  Positive Silfverskiold test noted with gastrocnemius equinus.  No pain at the peroneal tendon posterior tibial tendon ATFL ligament pain with dorsiflexion of the ankle joint no pain with plantarflexion of the ankle joint   Radiographs: None Assessment:   1. Left Achilles tendinitis     Plan:  Patient was evaluated and treated and all questions answered.  Left Achilles tendinitisWith underlying Haglund's deformity - All questions and concerns were discussed with the patient in extensive detail clinically cam boot immobilization injection has not helped.  At this time patient will benefit from an MRI evaluation MRI was ordered - Will discuss treatment options after the MRI is done.   No follow-ups on file.

## 2023-08-16 ENCOUNTER — Encounter: Payer: Self-pay | Admitting: Podiatry

## 2023-08-20 ENCOUNTER — Ambulatory Visit (INDEPENDENT_AMBULATORY_CARE_PROVIDER_SITE_OTHER): Admitting: Adult Health

## 2023-08-29 ENCOUNTER — Telehealth: Payer: Self-pay

## 2023-08-29 NOTE — Telephone Encounter (Signed)
 Orthotics in [place] bin

## 2023-08-29 NOTE — Telephone Encounter (Signed)
 LVM to schedule orthotic fitting/ pu

## 2023-08-30 ENCOUNTER — Ambulatory Visit
Admission: RE | Admit: 2023-08-30 | Discharge: 2023-08-30 | Disposition: A | Discharge status: Home / Self Care | Source: Ambulatory Visit | Attending: Podiatry | Admitting: Podiatry

## 2023-08-30 DIAGNOSIS — M7662 Achilles tendinitis, left leg: Secondary | ICD-10-CM

## 2023-09-03 ENCOUNTER — Ambulatory Visit (INDEPENDENT_AMBULATORY_CARE_PROVIDER_SITE_OTHER): Admitting: Adult Health

## 2023-09-03 ENCOUNTER — Telehealth (INDEPENDENT_AMBULATORY_CARE_PROVIDER_SITE_OTHER): Payer: Self-pay | Admitting: *Deleted

## 2023-09-03 VITALS — BP 117/76 | HR 77 | Temp 98.3°F | Ht 65.0 in | Wt 319.0 lb

## 2023-09-03 DIAGNOSIS — E669 Obesity, unspecified: Secondary | ICD-10-CM | POA: Diagnosis not present

## 2023-09-03 DIAGNOSIS — Z6841 Body Mass Index (BMI) 40.0 and over, adult: Secondary | ICD-10-CM

## 2023-09-03 DIAGNOSIS — I158 Other secondary hypertension: Secondary | ICD-10-CM

## 2023-09-03 DIAGNOSIS — E559 Vitamin D deficiency, unspecified: Secondary | ICD-10-CM | POA: Diagnosis not present

## 2023-09-03 DIAGNOSIS — R7303 Prediabetes: Secondary | ICD-10-CM

## 2023-09-03 MED ORDER — ZEPBOUND 5 MG/0.5ML ~~LOC~~ SOAJ: 5.0000 mg | SUBCUTANEOUS | 0 refills | Status: DC

## 2023-09-03 MED ORDER — VITAMIN D (ERGOCALCIFEROL) 1.25 MG (50000 UNIT) PO CAPS: 50000.0000 [IU] | ORAL_CAPSULE | ORAL | 0 refills | Status: DC

## 2023-09-03 NOTE — Telephone Encounter (Signed)
 Prior authorization has been submitted to plan, waiting for response.  Message from plan:Information regarding your request An active PA is already on file with expiration date of 79739395. Please wait to resubmit request within 60 days of that expiration date to obtain a PA renewal.  Message from Plan CaseId:[id number];Status:Approved;Review Type:Prior Auth;Coverage Start Date:07/09/2023;Coverage End Date:07/09/2024;. Authorization Expiration Date: July 09, 2024. Patient has been updated thru MyChart.

## 2023-09-03 NOTE — Progress Notes (Signed)
 WEIGHT SUMMARY AND BIOMETRICS  Vitals Temp: 98.3 F (36.8 C) BP: 117/76 Pulse Rate: 77 SpO2: 100 %   Anthropometric Measurements Height: 5' 5 (1.651 m) Weight: (!) 319 lb (144.7 kg) BMI (Calculated): 53.08 Weight at Last Visit: 323 LB Weight Lost Since Last Visit: 4 LB Weight Gained Since Last Visit: 0 Starting Weight: 345 LB Total Weight Loss (lbs): 26 lb (11.8 kg)   Body Composition  Body Fat %: 59 % Fat Mass (lbs): 188.4 lbs Muscle Mass (lbs): 124.2 lbs Visceral Fat Rating : 23   Other Clinical Data RMR: [id number] Fasting: NO Labs: NO Today's Visit #: 26 Starting Date: 11/22/21    Chief Complaint:   OBESITY Julia Booth is here to discuss her progress with her obesity treatment plan.  She is on the keeping a food journal and adhering to recommended goals of 1300-1500 calories and 90g protein and states she is following her eating plan approximately 75 % of the time.  She states she is exercising: None  Interim History:  She started on weekly Zepbound  2.5mg  on/about 07/10/2023 She has had 4 doses Denies mass in neck, dysphagia, dyspepsia, persistent hoarseness, abdominal pain, constipation. She has experienced nausea without vomiting for 1-2 days after GIP/GLP-1 administration. She endorses decreased cravings.  Hydration-She estimates to drink at least 64 oz water and occasionally 10 oz of either soda [facility]) or sweet tea. She reports drinking either soda or sweet tea 2- 4 times per week.  Subjective:   1. Other secondary hypertension BP at goal at [facility] manages furosemide  (LASIX ) 40 MG tablet  rosuvastatin  (CRESTOR ) 10 MG tablet  carvedilol  (COREG ) 12.5 MG tablet   2. Prediabetes Lab Results  Component Value Date   HGBA1C 5.9 (H) 06/04/2023   HGBA1C 5.9 (H) 05/09/2022   HGBA1C 5.9 (H) 11/22/2021    She started on weekly Zepbound  2.5mg  on/about 07/10/2023 She has had 4 doses Denies mass in neck, dysphagia, dyspepsia, persistent  hoarseness, abdominal pain, constipation. She has experienced nausea without vomiting for 1-2 days after GIP/GLP-1 administration. She endorses decreased cravings.  3. Vitamin D  deficiency  Latest Reference Range & Units 06/04/23 11:05  Vitamin D , 25-Hydroxy 30.0 - 100.0 ng/mL 22.8 (L)  (L): Data is abnormally low  She is on weekly Ergocalciferol - denies N/V/Muscle Weakness  Assessment/Plan:   1. Other secondary hypertension Continue healthy eating and increase daily walking Continue weekly GIP/GLP-1 therapy  2. Prediabetes (Primary) Continue healthy eating and increase daily walking Continue weekly GIP/GLP-1 therapy  3. Vitamin D  deficiency Refill - Vitamin D , Ergocalciferol , (DRISDOL ) 1.25 MG (50000 UNIT) CAPS capsule; Take 1 capsule (50,000 Units total) by mouth every 7 (seven) days.  Dispense: 12 capsule; Refill: 0  4. Obesity,current BMI 53.1 Refill and INCREASE tirzepatide  (ZEPBOUND ) 5 MG/0.5ML Pen Inject 5 mg into the skin once a week. Dispense: 2 mL, Refills: 0 ordered   Julia Booth is currently in the action stage of change. As such, her goal is to continue with weight loss efforts. She has agreed to keeping a food journal and adhering to recommended goals of 1300-1500 calories and 90g+ protein.   Exercise goals: All adults should avoid inactivity. Some physical activity is better than none, and adults who participate in any amount of physical activity gain some health benefits. For additional and more extensive health benefits, adults should increase their aerobic physical activity to 300 minutes (5 hours) a week of moderate-intensity, or 150 minutes a week of vigorous-intensity aerobic physical activity, or an equivalent  combination of moderate- and vigorous-intensity activity. Additional health benefits are gained by engaging in physical activity beyond this amount.   Behavioral modification strategies: increasing lean protein intake, decreasing simple carbohydrates,  increasing vegetables, increasing water intake, no skipping meals, meal planning and cooking strategies, keeping healthy foods in the home, ways to avoid boredom eating, and planning for success.  Julia Booth has agreed to follow-up with our clinic in 4 weeks. She was informed of the importance of frequent follow-up visits to maximize her success with intensive lifestyle modifications for her multiple health conditions.   Objective:   Blood pressure 117/76, pulse 77, temperature 98.3 F (36.8 C), height 5' 5 (1.651 m), weight (!) 319 lb (144.7 kg), SpO2 100%. Body mass index is 53.08 kg/m.  General: Cooperative, alert, well developed, in no acute distress. HEENT: Conjunctivae and lids unremarkable. Cardiovascular: Regular rhythm.  Lungs: Normal work of breathing. Neurologic: No focal deficits.   Lab Results  Component Value Date   CREATININE 0.65 06/04/2023   BUN 14 06/04/2023   NA 144 06/04/2023   K 4.6 06/04/2023   CL 105 06/04/2023   CO2 24 06/04/2023   Lab Results  Component Value Date   ALT 31 06/04/2023   AST 24 06/04/2023   ALKPHOS 142 (H) 06/04/2023   BILITOT 0.3 06/04/2023   Lab Results  Component Value Date   HGBA1C 5.9 (H) 06/04/2023   HGBA1C 5.9 (H) 05/09/2022   HGBA1C 5.9 (H) 11/22/2021   HGBA1C 6.0 (H) 05/08/2021   Lab Results  Component Value Date   INSULIN  15.4 06/04/2023   INSULIN  11.6 05/09/2022   INSULIN  17.8 11/22/2021   Lab Results  Component Value Date   TSH 1.030 11/22/2021   Lab Results  Component Value Date   CHOL 173 06/04/2023   HDL 66 06/04/2023   LDLCALC 99 06/04/2023   TRIG 36 06/04/2023   CHOLHDL 2.1 08/11/2021   Lab Results  Component Value Date   VD25OH 22.8 (L) 06/04/2023   VD25OH 34.2 05/09/2022   VD25OH 25.0 (L) 11/22/2021   Lab Results  Component Value Date   WBC 3.5 06/04/2023   HGB 11.8 06/04/2023   HCT 35.8 06/04/2023   MCV 86 06/04/2023   PLT 267 06/04/2023   No results found for: IRON, TIBC,  FERRITIN  Attestation Statements:   Reviewed by clinician on day of visit: allergies, medications, problem list, medical history, surgical history, family history, social history, and previous encounter notes.  I have reviewed the above documentation for accuracy and completeness, and I agree with the above. -  Oliver Neuwirth d. Bengie Kaucher, NP-C

## 2023-09-06 ENCOUNTER — Encounter (HOSPITAL_BASED_OUTPATIENT_CLINIC_OR_DEPARTMENT_OTHER): Payer: Self-pay

## 2023-09-09 ENCOUNTER — Encounter (HOSPITAL_BASED_OUTPATIENT_CLINIC_OR_DEPARTMENT_OTHER): Payer: Self-pay | Admitting: Family

## 2023-09-09 ENCOUNTER — Ambulatory Visit (HOSPITAL_BASED_OUTPATIENT_CLINIC_OR_DEPARTMENT_OTHER): Admitting: Family

## 2023-09-09 VITALS — BP 126/84 | HR 66 | Ht 65.0 in | Wt 322.0 lb

## 2023-09-09 DIAGNOSIS — I700 Atherosclerosis of aorta: Secondary | ICD-10-CM | POA: Diagnosis not present

## 2023-09-09 DIAGNOSIS — E782 Mixed hyperlipidemia: Secondary | ICD-10-CM | POA: Diagnosis not present

## 2023-09-09 DIAGNOSIS — I10 Essential (primary) hypertension: Secondary | ICD-10-CM | POA: Diagnosis not present

## 2023-09-09 DIAGNOSIS — R600 Localized edema: Secondary | ICD-10-CM

## 2023-09-09 MED ORDER — FUROSEMIDE 40 MG PO TABS: ORAL_TABLET | ORAL | 3 refills | Status: AC

## 2023-09-09 MED ORDER — ROSUVASTATIN CALCIUM 10 MG PO TABS: 10.0000 mg | ORAL_TABLET | Freq: Every day | ORAL | 1 refills | Status: DC

## 2023-09-09 NOTE — Progress Notes (Signed)
 Cardiology Office Note:  .   Date:  09/09/2023  ID:  Julia Booth, DOB Jul 15, 1970, MRN [id number] PCP: Gerome Brunet, DO  [facility] Providers Cardiologist:  Annabella Scarce, MD    History of Present Illness: Julia   Julia Booth is a 53 y.o. female  with a hx of SLE, aortic atherosclerosis, HTN, HLD, morbid obesity, prediabetes, OSA, COVID 04/2020.   Diagnosed with SLE in 1998 and follows with rheumatology. Initial cardiology evaluation 01/2021 for shortness of breath and dyspnea. Echo reported with normal LVEF. PFT and sleep test ordered by pulmonology.    Coronary CTA 01/2021 coronary calcium  score of 0 and aortic atherosclerosis. CPAP recommended for OSA.  At this Miss is 5/23 carvedilol  increased for palpitations and Wegovy  initiated for weight loss.  At visit 01/2022 furosemide  increased to 80 mg daily for 2 days however as LE edema due to venous adventurously thereafter it was reduced to 40 mg daily.  At visit 05/2022 she had chest discomfort atypical for angina and no ischemic evaluation recommended.   Last seen 09/24/2022.  Her BP was well-controlled.  Lasix  increased to 80 mg on Mondays Wednesdays Fridays and 40 mg all other days for edema.  Her home BP cuff was found to read 10 points higher systolic as compared to manual BP reading.  Updated echo 10/16/2022 normal LVEF 60 to 65%, normal diastolic parameters, RV mildly enlarged, mildly elevated PASP, trivial MR, mild to moderate TR.   Presents today for follow-up independently.  Since last seen through healthy weight and wellness/transition from Wegovy  to Zepbound , tolerating with only mild indigestion. Reports if her arm is folded up for a long time it will start to be numb and ache - discussed likely nerve related issue. Reports rare chest pain at rest that self resolves and is similar to previous. Reassured atypical for angina. She often has to take 2 fluid pills together every other day to help with LE edema with  improvement.  Reports her exertional dyspnea is overall stable from previous.   ROS: Please see the history of present illness.    All other systems reviewed and are negative.   Studies Reviewed: Julia   EKG Interpretation Date/Time:  Monday September 09 2023 09:41:26 EDT Ventricular Rate:  66 PR Interval:  146 QRS Duration:  104 QT Interval:  416 QTC Calculation: 436 R Axis:   1  Text Interpretation: Normal sinus rhythm Incomplete right bundle branch block Confirmed by Vannie Mora ([id number]) on 09/09/2023 9:46:16 AM      Cardiac Studies & Procedures   ______________________________________________________________________________________________     ECHOCARDIOGRAM  ECHOCARDIOGRAM COMPLETE 10/16/2022  Narrative ECHOCARDIOGRAM REPORT    Patient Name:   Julia Booth Date of Exam: 10/16/2022 Medical Rec #:  [id number]            Height:       65.0 in Accession #:    [id number]           Weight:       335.0 lb Date of Birth:  25-May-1970             BSA:          2.463 m Patient Age:    52 years             BP:           130/55 mmHg Patient Gender: F  HR:           68 bpm. Exam Location:  Outpatient  Procedure: 3D Echo, 2D Echo, Cardiac Doppler, Color Doppler and Strain Analysis  Indications:    Dyspnea  History:        Patient has prior history of Echocardiogram examinations, most recent 05/19/2020. Risk Factors:Hypertension and Former Smoker. PHTN.  Sonographer:    Orvil Holmes RDCS Referring Phys: [id number] Rosalie Buenaventura S Juelz Claar  IMPRESSIONS   1. Left ventricular ejection fraction, by estimation, is 60 to 65%. The left ventricle has normal function. The left ventricle has no regional wall motion abnormalities. Left ventricular diastolic parameters were normal. The average left ventricular global longitudinal strain is -20.4 %. The global longitudinal strain is normal. 2. Right ventricular systolic function is normal. The right ventricular size is mildly  enlarged. There is mildly elevated pulmonary artery systolic pressure. The estimated right ventricular systolic pressure is 37.8 mmHg. 3. The mitral valve is normal in structure. Trivial mitral valve regurgitation. No evidence of mitral stenosis. 4. Tricuspid valve regurgitation is mild to moderate. 5. The aortic valve is normal in structure. There is mild calcification of the aortic valve. Aortic valve regurgitation is not visualized. No aortic stenosis is present. 6. The inferior vena cava is normal in size with greater than 50% respiratory variability, suggesting right atrial pressure of 3 mmHg.  Comparison(s): Prior images unable to be directly viewed, comparison made by report only.  Conclusion(s)/Recommendation(s): Otherwise normal echocardiogram, with minor abnormalities described in the report.  FINDINGS Left Ventricle: Left ventricular ejection fraction, by estimation, is 60 to 65%. The left ventricle has normal function. The left ventricle has no regional wall motion abnormalities. The average left ventricular global longitudinal strain is -20.4 %. The global longitudinal strain is normal. The left ventricular internal cavity size was normal in size. There is no left ventricular hypertrophy. Left ventricular diastolic parameters were normal.  Right Ventricle: The right ventricular size is mildly enlarged. No increase in right ventricular wall thickness. Right ventricular systolic function is normal. There is mildly elevated pulmonary artery systolic pressure. The tricuspid regurgitant velocity is 2.95 m/s, and with an assumed right atrial pressure of 3 mmHg, the estimated right ventricular systolic pressure is 37.8 mmHg.  Left Atrium: Left atrial size was normal in size.  Right Atrium: Right atrial size was normal in size.  Pericardium: There is no evidence of pericardial effusion.  Mitral Valve: The mitral valve is normal in structure. Trivial mitral valve regurgitation. No evidence  of mitral valve stenosis.  Tricuspid Valve: The tricuspid valve is normal in structure. Tricuspid valve regurgitation is mild to moderate. No evidence of tricuspid stenosis.  Aortic Valve: The aortic valve is normal in structure. There is mild calcification of the aortic valve. There is mild aortic valve annular calcification. Aortic valve regurgitation is not visualized. No aortic stenosis is present.  Pulmonic Valve: The pulmonic valve was grossly normal. Pulmonic valve regurgitation is mild. No evidence of pulmonic stenosis.  Aorta: The aortic root, ascending aorta and aortic arch are all structurally normal, with no evidence of dilitation or obstruction.  Venous: The inferior vena cava is normal in size with greater than 50% respiratory variability, suggesting right atrial pressure of 3 mmHg.  IAS/Shunts: No atrial level shunt detected by color flow Doppler.   LEFT VENTRICLE PLAX 2D LVIDd:         4.34 cm   Diastology LVIDs:         2.32 cm   LV e'  medial:    8.81 cm/s LV PW:         1.56 cm   LV E/e' medial:  9.5 LV IVS:        1.01 cm   LV e' lateral:   9.36 cm/s LVOT diam:     2.00 cm   LV E/e' lateral: 9.0 LV SV:         80 LV SV Index:   33        2D Longitudinal Strain LVOT Area:     3.14 cm  2D Strain GLS (A2C):   -21.8 % 2D Strain GLS (A3C):   -18.9 % 2D Strain GLS (A4C):   -20.5 % 2D Strain GLS Avg:     -20.4 %  3D Volume EF: 3D EF:        63 % LV EDV:       151 ml LV ESV:       56 ml LV SV:        95 ml  RIGHT VENTRICLE RV Basal diam:  4.21 cm RV Mid diam:    3.70 cm RV S prime:     15.70 cm/s TAPSE (M-mode): 3.7 cm  LEFT ATRIUM             Index        RIGHT ATRIUM           Index LA diam:        4.10 cm 1.66 cm/m   RA Area:     13.30 cm LA Vol (A2C):   60.2 ml 24.44 ml/m  RA Volume:   31.30 ml  12.71 ml/m LA Vol (A4C):   66.0 ml 26.80 ml/m LA Biplane Vol: 66.3 ml 26.92 ml/m AORTIC VALVE LVOT Vmax:   121.00 cm/s LVOT Vmean:  76.100 cm/s LVOT VTI:     0.256 m  AORTA Ao Root diam: 3.00 cm Ao Asc diam:  3.20 cm  MITRAL VALVE               TRICUSPID VALVE MV Area (PHT): 3.42 cm    TR Peak grad:   34.8 mmHg MV Decel Time: 222 msec    TR Vmax:        295.00 cm/s MV E velocity: 83.90 cm/s MV A velocity: 77.70 cm/s  SHUNTS MV E/A ratio:  1.08        Systemic VTI:  0.26 m Systemic Diam: 2.00 cm  Shelda Bruckner MD Electronically signed by Shelda Bruckner MD Signature Date/Time: 10/16/2022/4:03:46 PM    Final      CT SCANS  CT CORONARY MORPH W/CTA COR W/SCORE 01/18/2021  Addendum 01/18/2021 10:25 AM ADDENDUM REPORT: 01/18/2021 10:22  HISTORY: 53 yo female with dyspnea on exertion (DOE)  EXAM: Cardiac/Coronary CTA  TECHNIQUE: The patient was scanned on a Office manager.  PROTOCOL: A 110 kV prospective scan was triggered in the descending thoracic aorta at 111 HU's. Axial non-contrast 3 mm slices were carried out through the heart. The data set was analyzed on a dedicated work station and scored using the Agatson method. Gantry rotation speed was 250 msecs and collimation was .6 mm. Beta blockade and 0.8 mg of sl NTG was given. The 3D data set was reconstructed in 5% intervals of the 35-75 % of the R-R cycle. Diastolic phases were analyzed on a dedicated work station using MPR, MIP and VRT modes. The patient received OMNIPAQUE  IOHEXOL  350 MG/ML SOLN of contrast.  FINDINGS: Quality: Fair, attenuation artifact,  bolus timing off, HR 63  Coronary calcium  score: The patient's coronary artery calcium  score is 0, which places the patient in the 0 percentile.  Coronary arteries: Normal coronary origins.  Right dominance.  Right Coronary Artery: Dominant.  Normal artery.  Left Main Coronary Artery: Normal. Bifurcates into the LAD and LCx arteries.  Left Anterior Descending Coronary Artery: Large anterior artery that extends around the apex. No disease.  Left Circumflex Artery: Tortuous,  lateral vessel without disease. Small distal OM branch without disease.  Aorta: Normal size, 28 mm at the mid ascending aorta (level of the PA bifurcation) measured double oblique. Aortic atherosclerosis. No dissection.  Aortic Valve: Trileaflet. No calcifications.  Other findings:  Normal pulmonary vein drainage into the left atrium.  Normal left atrial appendage without a thrombus.  Normal size of the pulmonary artery.  IMPRESSION: 1. No evidence of CAD, CADRADS = 0.  2. Coronary calcium  score of 0. This was 0 percentile for age and sex matched control.  3. Normal coronary origin with right dominance.  4. Aortic atherosclersosis.  5. Consider non-coronary causes of dyspnea.   Electronically Signed By: Vinie JAYSON Maxcy M.D. On: 01/18/2021 10:22  Narrative EXAM: OVER-READ INTERPRETATION  CT CHEST  The following report is an over-read performed by radiologist Dr. Rockey Kilts of [facility] on 01/18/2021. This over-read does not include interpretation of cardiac or coronary anatomy or pathology. The coronary CTA interpretation by the cardiologist is attached.  COMPARISON:  01/02/2021 high-resolution chest CT.  FINDINGS: Vascular: Aortic atherosclerosis. No central pulmonary embolism, on this non-dedicated study.  Mediastinum/Nodes: No imaged thoracic adenopathy.  Lungs/Pleura: No pleural fluid.  Right lower lobe scarring.  Upper Abdomen: Normal imaged portions of the liver, stomach.  Musculoskeletal: No acute osseous abnormality.  IMPRESSION: No acute findings in the imaged extracardiac chest.  Aortic Atherosclerosis (ICD10-I70.0).  Electronically Signed: By: Rockey Kilts M.D. On: 01/18/2021 08:49     ______________________________________________________________________________________________      Risk Assessment/Calculations:             Physical Exam:   VS:  BP 126/84 (BP Location: Left Arm, Patient Position: Sitting, Cuff  Size: Large)   Pulse 66   Ht 5' 5 (1.651 m)   Wt (!) 322 lb (146.1 kg)   SpO2 97%   BMI 53.58 kg/m    Wt Readings from Last 3 Encounters:  09/09/23 (!) 322 lb (146.1 kg)  09/03/23 (!) 319 lb (144.7 kg)  07/09/23 (!) 323 lb (146.5 kg)    GEN: Well nourished, overweight, well developed in no acute distress NECK: No JVD; No carotid bruits CARDIAC: RRR, no murmurs, rubs, gallops; chest wall tender on palpation RESPIRATORY:  Clear to auscultation without rales, wheezing or rhonchi  ABDOMEN: Soft, non-tender, non-distended EXTREMITIES:  Bilateral LE with trace, nonpitting edema; No deformity   ASSESSMENT AND PLAN: .    HTN - BP well controlled. Continue current antihypertensive regimen coreg  12.5mg  BID, Lasix  40mg  and 80mg  every other day. Discussed to monitor BP at home at least 2 hours after medications and sitting for 5-10 minutes.   OSA - CPAP compliance encouraged   Prediabetes / Morbid obesity - Weight loss via diet and exercise encouraged. Discussed the impact being overweight would have on cardiovascular risk. She is following with healthy weight and wellness clinic on Zepbound .   Chest pain - 01/2021 CTA coronary calcium  score of 0. Present chest pain atypical for angina. No indication for ischemic evaluation. Present pain atypical for angina. EKG today  no acute ST/T wave changes.   Aortic atherosclerosis / HLD- 05/27/2023 total cholesterol 173, triglycerides 36, HDL 66, LDL 99.  LDL the year prior of 53, does not recall missed doses. Will resume Rosuvastatin  10mg  daily and repeat fasting lipid panel, lipoprotein, ALT. If LDL not at goal at that time, plan to increase to 20mg .   SLE - Follows with rheumatology.  No longer requiring prednisone .  LE edema / Exertional dyspnea -echo 05/19/2020 LVEF 75%.  Echo 2024 normal LVEF, normal diastolic function, RV mildly enlarged. Likely etiology venous insufficiency. DOE stable from previous. Recommend low salt diet, elevating legs,  compressions stockings.  Continue Lasix  80mg  and 40mg  every other day. 05/2023 stable renal function.       Dispo: follow up in 6 mos  Signed, Reche GORMAN Finder, NP

## 2023-09-09 NOTE — Patient Instructions (Addendum)
 Medication Instructions:  Your physician recommends that you continue on your current medications as directed. Please refer to the Current Medication list given to you today.  Lab Work: Fasting lab work in 2 months (Lipid panel, Lpa, Alt)  Testing/Procedures: NONE ordered at this time of appointment   Follow-Up: At [facility], you and your health needs are our priority.  As part of our continuing mission to provide you with exceptional heart care, our providers are all part of one team.  This team includes your primary Cardiologist (physician) and Advanced Practice Providers or APPs (Physician Assistants and Nurse Practitioners) who all work together to provide you with the care you need, when you need it.  Your next appointment:   6 month(s)  Provider:   Annabella Scarce, MD    We recommend signing up for the patient portal called MyChart.  Sign up information is provided on this After Visit Summary.  MyChart is used to connect with patients for Virtual Visits (Telemedicine).  Patients are able to view lab/test results, encounter notes, upcoming appointments, etc.  Non-urgent messages can be sent to your provider as well.   To learn more about what you can do with MyChart, go to [email or web address].   Other Instructions     Heart Healthy Diet Recommendations: A low-salt diet is recommended. Meats should be grilled, baked, or boiled. Avoid fried foods. Focus on lean protein sources like fish or chicken with vegetables and fruits. The American Heart Association is a Chief Technology Officer!  American Heart Association Diet and Lifeystyle Recommendations   Exercise recommendations: The American Heart Association recommends 150 minutes of moderate intensity exercise weekly. Try 30 minutes of moderate intensity exercise 4-5 times per week. This could include walking, jogging, or swimming.

## 2023-10-08 ENCOUNTER — Ambulatory Visit (HOSPITAL_BASED_OUTPATIENT_CLINIC_OR_DEPARTMENT_OTHER): Admitting: Family

## 2023-10-10 ENCOUNTER — Ambulatory Visit: Admitting: Podiatry

## 2023-10-15 ENCOUNTER — Ambulatory Visit (INDEPENDENT_AMBULATORY_CARE_PROVIDER_SITE_OTHER): Admitting: Adult Health

## 2023-10-15 VITALS — BP 138/82 | HR 68 | Temp 98.8°F | Ht 65.0 in | Wt 322.0 lb

## 2023-10-15 DIAGNOSIS — E669 Obesity, unspecified: Secondary | ICD-10-CM

## 2023-10-15 DIAGNOSIS — E559 Vitamin D deficiency, unspecified: Secondary | ICD-10-CM | POA: Diagnosis not present

## 2023-10-15 DIAGNOSIS — M329 Systemic lupus erythematosus, unspecified: Secondary | ICD-10-CM

## 2023-10-15 DIAGNOSIS — R7303 Prediabetes: Secondary | ICD-10-CM

## 2023-10-15 DIAGNOSIS — Z6841 Body Mass Index (BMI) 40.0 and over, adult: Secondary | ICD-10-CM

## 2023-10-15 DIAGNOSIS — M170 Bilateral primary osteoarthritis of knee: Secondary | ICD-10-CM

## 2023-10-15 MED ORDER — ZEPBOUND 5 MG/0.5ML ~~LOC~~ SOAJ: 5.0000 mg | SUBCUTANEOUS | 0 refills | Status: DC

## 2023-10-15 MED ORDER — ZEPBOUND 5 MG/0.5ML ~~LOC~~ SOAJ: 5.0000 mg | SUBCUTANEOUS | 1 refills | Status: DC

## 2023-10-15 MED ORDER — VITAMIN D (ERGOCALCIFEROL) 1.25 MG (50000 UNIT) PO CAPS: 50000.0000 [IU] | ORAL_CAPSULE | ORAL | 0 refills | Status: DC

## 2023-10-15 NOTE — Progress Notes (Signed)
 WEIGHT SUMMARY AND BIOMETRICS  Vitals Temp: 98.8 F (37.1 C) BP: 138/82 Pulse Rate: 68 SpO2: 100 %   Anthropometric Measurements Height: 5' 5 (1.651 m) Weight: (!) 322 lb (146.1 kg) BMI (Calculated): 53.58 Weight at Last Visit: 319 lb Weight Lost Since Last Visit: 3 lb Weight Gained Since Last Visit: 0 Starting Weight: 345 lb Total Weight Loss (lbs): 23 lb (10.4 kg)   Body Composition  Body Fat %: 60.3 % Fat Mass (lbs): 194.4 lbs Muscle Mass (lbs): 121.6 lbs Visceral Fat Rating : 24   Other Clinical Data RMR: [id number] Fasting: no Labs: no Today's Visit #: 27 Starting Date: 11/22/21    Chief Complaint:   OBESITY Julia Booth is here to discuss her progress with her obesity treatment plan.  She is on the keeping a food journal and adhering to recommended goals of 1300-1500 calories and 90g+ protein and states she is following her eating plan approximately 50 % of the time.  She states she is exercising Light Walking 15 minutes 3 times per week.  Interim History:  She started on weekly Zepbound  2.5mg  on/about 07/10/2023 Zepbound  2.5mg  increased to 5mg  on/about 09/03/2023  If she doesn't meal plan and prep- she will either skip meal or take out meals ([place], [employer], [place], [place]) She estimates to eat out 2-3 times per week for either lunch or dinner    Subjective:   1. Osteoarthritis of both knees, unspecified osteoarthritis type Current BMI 53.6 She needs to achive a BMI < 40 to qualify for joint replacement. L knee is more symptomatic than R Knee  2. SLE (systemic lupus erythematosus related syndrome) (HCC) hydroxychloroquine (PLAQUENIL) 200 MG tablet BID  Liver Enzymes normal since 08/2021  3. Prediabetes Lab Results  Component Value Date   HGBA1C 5.9 (H) 06/04/2023   HGBA1C 5.9 (H) 05/09/2022   HGBA1C 5.9 (H) 11/22/2021     Latest Reference Range & Units 11/22/21 09:10 05/09/22 13:06 06/04/23 11:05  INSULIN  2.6 - 24.9 uIU/mL 17.8 11.6 15.4    She started on weekly Zepbound  2.5mg  on/about 07/10/2023 Zepbound  2.5mg  increased to 5mg  on/about 09/03/2023 Denies mass in neck, dysphagia, dyspepsia, persistent hoarseness, abdominal pain, or N/V/C   4. Vitamin D  deficiency  Latest Reference Range & Units 11/22/21 09:10 05/09/22 13:06 06/04/23 11:05  Vitamin D , 25-Hydroxy 30.0 - 100.0 ng/mL 25.0 (L) 34.2 22.8 (L)  (L): Data is abnormally low  She is on weekly Ergocalciferol -denies N/V/Muscle Weakness  Assessment/Plan:   1. Osteoarthritis of both knees, unspecified osteoarthritis type Continue with weight loss efforts Advance activity as tolerated, use seated exercises   2. SLE (systemic lupus erythematosus related syndrome) (HCC) Continue DMARD therapy per specialist  3. Prediabetes (Primary) Continue healthy eating and regular daily activity  4. Vitamin D  deficiency Refill  Vitamin D , Ergocalciferol , (DRISDOL ) 1.25 MG (50000 UNIT) CAPS capsule Take 1 capsule (50,000 Units total) by mouth every 7 (seven) days. Dispense: 12 capsule, Refills: 0 ordered   5. Obesity,current BMI 53.6 Refill tirzepatide  (ZEPBOUND ) 5 MG/0.5ML Pen Inject 5 mg into the skin once a week. Dispense: 2 mL, Refills: 1 ordered   Julia Booth is currently in the action stage of change. As such, her goal is to continue with weight loss efforts. She has agreed to keeping a food journal and adhering to recommended goals of 1300-1500 calories and 90g+ protein.   Exercise goals: All adults should avoid inactivity. Some physical activity is better than none, and adults who participate in any amount of physical  activity gain some health benefits. Adults should also include muscle-strengthening activities that involve all major muscle groups on 2 or more days a week.  Behavioral modification strategies: increasing lean protein intake, decreasing simple carbohydrates, increasing vegetables, increasing water intake, decreasing eating out, no skipping meals, meal planning  and cooking strategies, keeping healthy foods in the home, ways to avoid boredom eating, ways to avoid night time snacking, emotional eating strategies, and planning for success.  [name] has agreed to follow-up with our clinic in 4 weeks. She was informed of the importance of frequent follow-up visits to maximize her success with intensive lifestyle modifications for her multiple health conditions.   Check Fasting Labs this Fall 2025  Objective:   Blood pressure 138/82, pulse 68, temperature 98.8 F (37.1 C), height 5' 5 (1.651 m), weight (!) 322 lb (146.1 kg), SpO2 100%. Body mass index is 53.58 kg/m.  General: Cooperative, alert, well developed, in no acute distress. HEENT: Conjunctivae and lids unremarkable. Cardiovascular: Regular rhythm.  Lungs: Normal work of breathing. Neurologic: No focal deficits.   Lab Results  Component Value Date   CREATININE 0.65 06/04/2023   BUN 14 06/04/2023   NA 144 06/04/2023   K 4.6 06/04/2023   CL 105 06/04/2023   CO2 24 06/04/2023   Lab Results  Component Value Date   ALT 31 06/04/2023   AST 24 06/04/2023   ALKPHOS 142 (H) 06/04/2023   BILITOT 0.3 06/04/2023   Lab Results  Component Value Date   HGBA1C 5.9 (H) 06/04/2023   HGBA1C 5.9 (H) 05/09/2022   HGBA1C 5.9 (H) 11/22/2021   HGBA1C 6.0 (H) 05/08/2021   Lab Results  Component Value Date   INSULIN  15.4 06/04/2023   INSULIN  11.6 05/09/2022   INSULIN  17.8 11/22/2021   Lab Results  Component Value Date   TSH 1.030 11/22/2021   Lab Results  Component Value Date   CHOL 173 06/04/2023   HDL 66 06/04/2023   LDLCALC 99 06/04/2023   TRIG 36 06/04/2023   CHOLHDL 2.1 08/11/2021   Lab Results  Component Value Date   VD25OH 22.8 (L) 06/04/2023   VD25OH 34.2 05/09/2022   VD25OH 25.0 (L) 11/22/2021   Lab Results  Component Value Date   WBC 3.5 06/04/2023   HGB 11.8 06/04/2023   HCT 35.8 06/04/2023   MCV 86 06/04/2023   PLT 267 06/04/2023   No results found for:  IRON, TIBC, FERRITIN  Attestation Statements:   Reviewed by clinician on day of visit: allergies, medications, problem list, medical history, surgical history, family history, social history, and previous encounter notes.  I have reviewed the above documentation for accuracy and completeness, and I agree with the above. -  Linnie Delgrande d. Fines Kimberlin, NP-C

## 2023-10-29 ENCOUNTER — Ambulatory Visit (INDEPENDENT_AMBULATORY_CARE_PROVIDER_SITE_OTHER): Admitting: Podiatry

## 2023-10-29 DIAGNOSIS — M7662 Achilles tendinitis, left leg: Secondary | ICD-10-CM | POA: Diagnosis not present

## 2023-10-29 DIAGNOSIS — M21962 Unspecified acquired deformity of left lower leg: Secondary | ICD-10-CM | POA: Diagnosis not present

## 2023-10-29 DIAGNOSIS — M9262 Juvenile osteochondrosis of tarsus, left ankle: Secondary | ICD-10-CM | POA: Diagnosis not present

## 2023-10-29 DIAGNOSIS — M21961 Unspecified acquired deformity of right lower leg: Secondary | ICD-10-CM | POA: Diagnosis not present

## 2023-10-29 NOTE — Progress Notes (Signed)
 Subjective:  Patient ID: Julia Booth, female    DOB: February 02, 1971,  MRN: [id number]  Chief Complaint  Patient presents with   Left Achilles tendinitis    Pt stated that she is doing better than she was but still has some tightness and discomfort by the end of the day     53 y.o. female presents with the above complaint.  Patient presents with complaint of left Achilles tendinitis and insertional pain.  She states it still hurts.  Would like to discuss next treatment plan.  Denies any other acute complaints   Review of Systems: Negative except as noted in the HPI. Denies N/V/F/Ch.  Past Medical History:  Diagnosis Date   Aortic atherosclerosis 05/08/2021   Essential hypertension 01/05/2021   Exertional dyspnea 01/05/2021   Knee pain    Lupus    OSA (obstructive sleep apnea) 01/05/2021   Pre-diabetes    Pulmonary hypertension, unspecified (HCC) 01/05/2021    Current Outpatient Medications:    acetaminophen (TYLENOL) 500 MG tablet, 1 tablet as needed Orally every 6 hrs, Disp: , Rfl:    albuterol  (VENTOLIN  HFA) 108 (90 Base) MCG/ACT inhaler, Inhale 2 puffs into the lungs every 6 (six) hours as needed for wheezing or shortness of breath., Disp: 8 g, Rfl: 2   azathioprine (IMURAN) 100 MG tablet, Take 100 mg by mouth daily., Disp: , Rfl:    budesonide -formoterol  (SYMBICORT ) 80-4.5 MCG/ACT inhaler, Inhale 2 puffs into the lungs in the morning and at bedtime., Disp: 1 each, Rfl: 12   carvedilol  (COREG ) 12.5 MG tablet, Take 1 tablet (12.5 mg total) by mouth 2 (two) times daily., Disp: 180 tablet, Rfl: 1   cholecalciferol (VITAMIN D3) 25 MCG (1000 UNIT) tablet, 1 tablet Orally Once a day, Disp: , Rfl:    furosemide  (LASIX ) 40 MG tablet, Take 2 tablets (80mg ) on Mondays, Wednesdays, Friday and 1 tablet (40mg ) every other day., Disp: 135 tablet, Rfl: 3   hydroxychloroquine (PLAQUENIL) 200 MG tablet, Take 200 mg by mouth 2 (two) times daily., Disp: , Rfl:    ibuprofen (ADVIL) 800 MG  tablet, 1 tablet with food or milk as needed Orally every 8 hrs, Disp: , Rfl:    levocetirizine (XYZAL ALLERGY 24HR) 5 MG tablet, , Disp: , Rfl:    Magnesium 300 MG CAPS, 1 capsule with a meal Orally Once a day, Disp: , Rfl:    Omega-3 Fatty Acids (FISH OIL) 1000 MG CAPS, 1 capsule., Disp: , Rfl:    omeprazole (PRILOSEC OTC) 20 MG tablet, 1 tablet 30 minutes before morning meal Orally Once a day for 30 day(s), Disp: , Rfl:    omeprazole (PRILOSEC) 20 MG capsule, Take 20 mg by mouth every morning., Disp: , Rfl:    predniSONE  (DELTASONE ) 10 MG tablet, Take 10 mg by mouth daily as needed., Disp: , Rfl:    rosuvastatin  (CRESTOR ) 10 MG tablet, Take 1 tablet (10 mg total) by mouth daily., Disp: 90 tablet, Rfl: 1   tirzepatide  (ZEPBOUND ) 5 MG/0.5ML Pen, Inject 5 mg into the skin once a week., Disp: 2 mL, Rfl: 1   Vitamin D , Ergocalciferol , (DRISDOL ) 1.25 MG (50000 UNIT) CAPS capsule, Take 1 capsule (50,000 Units total) by mouth every 7 (seven) days., Disp: 12 capsule, Rfl: 0  Social History   Tobacco Use  Smoking Status Former   Types: Cigarettes  Smokeless Tobacco Never  Tobacco Comments   Smokes with stress- 1 time a month average    Allergies  Allergen Reactions  Penicillins Hives, Other (See Comments) and Rash    NATURAL PENICILLINS:  - Converted from [employer] NATURAL PENICILLINS:  - Converted from [employer]    Sulfa Antibiotics Rash    Other reaction(s): Other (See Comments) Caused flare of her Lupus SULFONAMIDES:  - Converted from [employer] Lupus flare up SULFONAMIDES:  - Converted from [employer] Lupus flare up    Amoxicillin-Pot Clavulanate Rash   Belimumab     Other Reaction(s): Depression   Objective:  There were no vitals filed for this visit. There is no height or weight on file to calculate BMI. Constitutional Well developed. Well nourished.  Vascular Dorsalis pedis pulses palpable bilaterally. Posterior tibial pulses palpable bilaterally. Capillary refill  normal to all digits.  No cyanosis or clubbing noted. Pedal hair growth normal.  Neurologic Normal speech. Oriented to person, place, and time. Epicritic sensation to light touch grossly present bilaterally.  Dermatologic Nails well groomed and normal in appearance. No open wounds. No skin lesions.  Orthopedic: Pain on palpation left Achilles tendon insertional pain.  Positive Haglund's deformity noted.  Positive Silfverskiold test noted with gastrocnemius equinus.  No pain at the peroneal tendon posterior tibial tendon ATFL ligament pain with dorsiflexion of the ankle joint no pain with plantarflexion of the ankle joint   Radiographs: MPRESSION: Mild subcutaneous edema surrounding the ankle. Mild degenerative change.   No acute abnormality., Specifically no evidence of Achilles tendinitis. Trace retrocalcaneal bursal fluid is present. Assessment:   1. Left Achilles tendinitis   2. Haglund's deformity, left   3. Foot deformity, bilateral      Plan:  Patient was evaluated and treated and all questions answered.  Left Achilles tendinitisWith underlying Haglund's deformity - All questions and concerns were discussed with the patient in extensive MRI was reviewed with the patient in extensive detail does not show gross tearing across the Achilles tendon or thickening of the Achilles tendon at this time.  Given the findings patient would benefit from physical therapy - Prescription for physical therapy was given she states understanding. - Orthotics were dispensed they are functioning well no acute complaints.  Pes planovalgus/foot deformity -I explained to patient the etiology of pes planovalgus and relationship with heel pain/arch pain and various treatment options were discussed.  Given patient foot structure in the setting of heel pain/arch pain I believe patient will benefit from custom-made orthotics to help control the hindfoot motion support the arch of the foot and take the  stress away from arches.  Patient agrees with the plan like to proceed with orthotics - Orthotics were dispensed are functioning well no acute complaints    No follow-ups on file.

## 2023-11-12 ENCOUNTER — Ambulatory Visit (INDEPENDENT_AMBULATORY_CARE_PROVIDER_SITE_OTHER): Admitting: Adult Health

## 2023-11-12 VITALS — BP 116/78 | HR 83 | Temp 98.6°F | Ht 65.0 in | Wt 319.0 lb

## 2023-11-12 DIAGNOSIS — E559 Vitamin D deficiency, unspecified: Secondary | ICD-10-CM | POA: Diagnosis not present

## 2023-11-12 DIAGNOSIS — M329 Systemic lupus erythematosus, unspecified: Secondary | ICD-10-CM

## 2023-11-12 DIAGNOSIS — E669 Obesity, unspecified: Secondary | ICD-10-CM | POA: Diagnosis not present

## 2023-11-12 DIAGNOSIS — E66813 Obesity, class 3: Secondary | ICD-10-CM

## 2023-11-12 DIAGNOSIS — Z6841 Body Mass Index (BMI) 40.0 and over, adult: Secondary | ICD-10-CM

## 2023-11-12 DIAGNOSIS — R7303 Prediabetes: Secondary | ICD-10-CM | POA: Diagnosis not present

## 2023-11-12 MED ORDER — VITAMIN D (ERGOCALCIFEROL) 1.25 MG (50000 UNIT) PO CAPS: 50000.0000 [IU] | ORAL_CAPSULE | ORAL | 0 refills | Status: DC

## 2023-11-12 MED ORDER — ZEPBOUND 5 MG/0.5ML ~~LOC~~ SOAJ: 5.0000 mg | SUBCUTANEOUS | 1 refills | Status: DC

## 2023-11-12 NOTE — Progress Notes (Unsigned)
 WEIGHT SUMMARY AND BIOMETRICS  Vitals Temp: 98.6 F (37 C) BP: 116/78 Pulse Rate: 83 SpO2: 99 %   Anthropometric Measurements Height: 5' 5 (1.651 m) Weight: (!) 319 lb (144.7 kg) BMI (Calculated): 53.08 Weight at Last Visit: 322lb Weight Lost Since Last Visit: 3lb Weight Gained Since Last Visit: 0lb Starting Weight: 345lb Total Weight Loss (lbs): 36 lb (16.3 kg)   Body Composition  Body Fat %: 59.2 % Fat Mass (lbs): 189.2 lbs Muscle Mass (lbs): 123.8 lbs Visceral Fat Rating : 23   Other Clinical Data RMR: [id number] Fasting: No Labs: No Today's Visit #: 28 Starting Date: 11/22/21    Chief Complaint:   OBESITY Julia Booth is here to discuss her progress with her obesity treatment plan.  She is on the keeping a food journal and adhering to recommended goals of 1300 calories and 90 protein and states she is following her eating plan approximately 85 % of the time.  She states she is exercising Walking 15 minutes 3 times per week.  Interim History:  Julia Booth provided the following food recall that is typical of a day: Breakfast: Coffee with creamer, either fruit or a smoothie Lunch: 4 oz meat protein sandwich or salad with grilled chicken or peanut butter and jelly sandwich (her go-to). Dinner: 8-9 oz of cooked meat protein with grilled vegetables and small serving of a simple CHO (ie: baked potato or sweet potato)  She is currently on weekly Zepbound - tolerating well.  Hydration-she estimates to drink 8-10 glasses of water daily.  Subjective:   1. Vitamin D  deficiency  Latest Reference Range & Units 11/22/21 09:10 05/09/22 13:06 06/04/23 11:05  Vitamin D , 25-Hydroxy 30.0 - 100.0 ng/mL 25.0 (L) 34.2 22.8 (L)  (L): Data is abnormally low  Vit D level remain subtherapeutic She is on weekly Ergocalciferol  She denies N/V/Muscle Weakness  2. SLE (systemic lupus erythematosus related syndrome) (HCC) hydroxychloroquine (PLAQUENIL) 200 MG tablet BID    Liver Enzymes normal since 08/2021  She denies acute lupus sx's at present  3. Prediabetes Lab Results  Component Value Date   HGBA1C 5.9 (H) 06/04/2023   HGBA1C 5.9 (H) 05/09/2022   HGBA1C 5.9 (H) 11/22/2021    She is on weekly Zepbound  5mg  Denies mass in neck, dysphagia, dyspepsia, persistent hoarseness, abdominal pain, or N/V/C  She denies sx's of hypoglycemia  Assessment/Plan:   1. Vitamin D  deficiency (Primary) Refill - Vitamin D , Ergocalciferol , (DRISDOL ) 1.25 MG (50000 UNIT) CAPS capsule; Take 1 capsule (50,000 Units total) by mouth every 7 (seven) days.  Dispense: 12 capsule; Refill: 0  2. SLE (systemic lupus erythematosus related syndrome) (HCC) hydroxychloroquine (PLAQUENIL) 200 MG tablet BID Monitor CMP F/u with specialist as instructed and PRN.  3. Prediabetes Refill   tirzepatide  (ZEPBOUND ) 5 MG/0.5ML Pen Inject 5 mg into the skin once a week. Dispense: 2 mL, Refills: 1 ordered    4. Obesity,current BMI 53.2 Refill   tirzepatide  (ZEPBOUND ) 5 MG/0.5ML Pen Inject 5 mg into the skin once a week. Dispense: 2 mL, Refills: 1 ordered   Julia Booth is currently in the action stage of change. As such, her goal is to continue with weight loss efforts. She has agreed to keeping a food journal and adhering to recommended goals of 1300 calories and 90 protein.   Exercise goals: All adults should avoid inactivity. Some physical activity is better than none, and adults who participate in any amount of physical activity gain some health benefits. Adults should also include muscle-strengthening activities  that involve all major muscle groups on 2 or more days a week.  Behavioral modification strategies: increasing lean protein intake, decreasing simple carbohydrates, increasing vegetables, increasing water intake, better snacking choices, emotional eating strategies, dealing with family or coworker sabotage, and planning for success.  Julia Booth has agreed to follow-up with our  clinic in 4 weeks. She was informed of the importance of frequent follow-up visits to maximize her success with intensive lifestyle modifications for her multiple health conditions.   Objective:   Blood pressure 116/78, pulse 83, temperature 98.6 F (37 C), height 5' 5 (1.651 m), weight (!) 319 lb (144.7 kg), SpO2 99%. Body mass index is 53.08 kg/m.  General: Cooperative, alert, well developed, in no acute distress. HEENT: Conjunctivae and lids unremarkable. Cardiovascular: Regular rhythm.  Lungs: Normal work of breathing. Neurologic: No focal deficits.   Lab Results  Component Value Date   CREATININE 0.65 06/04/2023   BUN 14 06/04/2023   NA 144 06/04/2023   K 4.6 06/04/2023   CL 105 06/04/2023   CO2 24 06/04/2023   Lab Results  Component Value Date   ALT 31 06/04/2023   AST 24 06/04/2023   ALKPHOS 142 (H) 06/04/2023   BILITOT 0.3 06/04/2023   Lab Results  Component Value Date   HGBA1C 5.9 (H) 06/04/2023   HGBA1C 5.9 (H) 05/09/2022   HGBA1C 5.9 (H) 11/22/2021   HGBA1C 6.0 (H) 05/08/2021   Lab Results  Component Value Date   INSULIN  15.4 06/04/2023   INSULIN  11.6 05/09/2022   INSULIN  17.8 11/22/2021   Lab Results  Component Value Date   TSH 1.030 11/22/2021   Lab Results  Component Value Date   CHOL 173 06/04/2023   HDL 66 06/04/2023   LDLCALC 99 06/04/2023   TRIG 36 06/04/2023   CHOLHDL 2.1 08/11/2021   Lab Results  Component Value Date   VD25OH 22.8 (L) 06/04/2023   VD25OH 34.2 05/09/2022   VD25OH 25.0 (L) 11/22/2021   Lab Results  Component Value Date   WBC 3.5 06/04/2023   HGB 11.8 06/04/2023   HCT 35.8 06/04/2023   MCV 86 06/04/2023   PLT 267 06/04/2023   No results found for: IRON, TIBC, FERRITIN  Attestation Statements:   Reviewed by clinician on day of visit: allergies, medications, problem list, medical history, surgical history, family history, social history, and previous encounter notes.  I have reviewed the above  documentation for accuracy and completeness, and I agree with the above. -  Jameya Pontiff d. Quantarius Genrich, NP-C

## 2023-12-11 ENCOUNTER — Ambulatory Visit (INDEPENDENT_AMBULATORY_CARE_PROVIDER_SITE_OTHER): Payer: Self-pay | Admitting: Adult Health

## 2023-12-11 ENCOUNTER — Encounter (INDEPENDENT_AMBULATORY_CARE_PROVIDER_SITE_OTHER): Payer: Self-pay | Admitting: Adult Health

## 2023-12-11 VITALS — BP 122/75 | HR 84 | Temp 98.3°F | Ht 65.0 in | Wt 313.0 lb

## 2023-12-11 DIAGNOSIS — E559 Vitamin D deficiency, unspecified: Secondary | ICD-10-CM

## 2023-12-11 DIAGNOSIS — E66813 Obesity, class 3: Secondary | ICD-10-CM

## 2023-12-11 DIAGNOSIS — Z6841 Body Mass Index (BMI) 40.0 and over, adult: Secondary | ICD-10-CM

## 2023-12-11 DIAGNOSIS — I10 Essential (primary) hypertension: Secondary | ICD-10-CM | POA: Diagnosis not present

## 2023-12-11 DIAGNOSIS — R5383 Other fatigue: Secondary | ICD-10-CM

## 2023-12-11 DIAGNOSIS — R233 Spontaneous ecchymoses: Secondary | ICD-10-CM

## 2023-12-11 DIAGNOSIS — R7303 Prediabetes: Secondary | ICD-10-CM

## 2023-12-11 DIAGNOSIS — I700 Atherosclerosis of aorta: Secondary | ICD-10-CM

## 2023-12-11 DIAGNOSIS — E669 Obesity, unspecified: Secondary | ICD-10-CM

## 2023-12-11 MED ORDER — ZEPBOUND 5 MG/0.5ML ~~LOC~~ SOAJ: 5.0000 mg | SUBCUTANEOUS | 1 refills | Status: DC

## 2023-12-11 NOTE — Progress Notes (Signed)
 WEIGHT SUMMARY AND BIOMETRICS  Vitals Temp: 98.3 F (36.8 C) BP: 122/75 Pulse Rate: 84 SpO2: 100 %   Anthropometric Measurements Height: 5' 5 (1.651 m) Weight: (!) 313 lb (142 kg) BMI (Calculated): 52.09 Weight at Last Visit: 319lb Weight Lost Since Last Visit: 6lb Weight Gained Since Last Visit: 0lb Starting Weight: 345lb Total Weight Loss (lbs): 32 lb (14.5 kg)   Body Composition  Body Fat %: 58.4 % Fat Mass (lbs): 182.8 lbs Muscle Mass (lbs): 123 lbs Visceral Fat Rating : 22   Other Clinical Data RMR: [id number] Fasting: Yes Labs: Yes Today's Visit #: 75 Starting Date: 11/22/21    Chief Complaint:   OBESITY Julia Booth is here to discuss her progress with her obesity treatment plan.  She is on the keeping a food journal and adhering to recommended goals of 1300 calories and 90g+ protein and states she is following her eating plan approximately 85 % of the time.  She states she is exercising Walking 15 minutes 3 times per week.  Interim History:  She started on weekly Zepbound  2.5mg  on/about 07/10/2023 Zepbound  2.5mg  increased to 5mg  on/about 09/03/2023 Denies mass in neck, dysphagia, dyspepsia, persistent hoarseness, abdominal pain, or N/V/C   Reviewed Bioimpedance Results with pt: Muscle Mass: -0.8 lb Adipose Mass: - 6.4 lbs  She is walking at least 3 times weekly- denies CP with exertion  Subjective:   1. Aortic atherosclerosis She believes that her father passed away from CVA at age 68 She started nightly CPAP > 5 years Last 12 months she wears at least 4 hrs/nightly She reports increased fatigue the last 2 weeks She is using weekly Zepbound  5mg : HTN, prediabetes, HLD, OSA, Morbid Obesity  2. Essential hypertension BP at goal at OV She is walking at least 3 times weekly- denies CP with exertion  3. Prediabetes Lab Results  Component Value Date   HGBA1C 5.9 (H) 06/04/2023   HGBA1C 5.9 (H) 05/09/2022   HGBA1C 5.9 (H) 11/22/2021    She  started on weekly Zepbound  2.5mg  on/about 07/10/2023 Zepbound  2.5mg  increased to 5mg  on/about 09/03/2023 Denies mass in neck, dysphagia, dyspepsia, persistent hoarseness, abdominal pain, or N/V/C   4. Vitamin D  deficiency She reports increased fatigue the last 2 weeks She is on weekly Ergocalciferol - consider increasing after lab results post  5. Easy Bruising She reports easy bruising on upper and lower extremities the lat 2 months She is unsure if she is bumping into furniture/object  Assessment/Plan:   1. Aortic atherosclerosis Patient was counseled on the importance of maintaining healthy lifestyle habits, including balanced nutrition, regular physical activity, and behavioral modifications, while taking antiobesity medication.   Patient verbalized understanding that medication is an adjunct to, not a replacement for, lifestyle changes and that the long-term success and weight maintenance depend on continued adherence to these strategies.  2. Essential hypertension Check Labs - Comprehensive metabolic panel with GFR  3. Prediabetes Check Labs - Hemoglobin A1c - Insulin , random  Patient was counseled on the importance of maintaining healthy lifestyle habits, including balanced nutrition, regular physical activity, and behavioral modifications, while taking antiobesity medication.   Patient verbalized understanding that medication is an adjunct to, not a replacement for, lifestyle changes and that the long-term success and weight maintenance depend on continued adherence to these strategies.  4. Vitamin D  deficiency Check Labs - VITAMIN D  25 Hydroxy (Vit-D Deficiency, Fractures)  5. Easy Bruising Check Labs -CBC with Diff  5. Obesity,current BMI 53.2 (Primary) Refill  tirzepatide  (ZEPBOUND )  5 MG/0.5ML Pen Inject 5 mg into the skin once a week. Dispense: 2 mL, Refills: 1 ordered   Patient was counseled on the importance of maintaining healthy lifestyle habits, including  balanced nutrition, regular physical activity, and behavioral modifications, while taking antiobesity medication.   Patient verbalized understanding that medication is an adjunct to, not a replacement for, lifestyle changes and that the long-term success and weight maintenance depend on continued adherence to these strategies.  Julia Booth is currently in the action stage of change. As such, her goal is to continue with weight loss efforts. She has agreed to keeping a food journal and adhering to recommended goals of 1300 calories and 90g+ protein.   Exercise goals: All adults should avoid inactivity. Some physical activity is better than none, and adults who participate in any amount of physical activity gain some health benefits. Adults should also include muscle-strengthening activities that involve all major muscle groups on 2 or more days a week. Walk 30 mins at least 4 times weekly.  Behavioral modification strategies: increasing lean protein intake, decreasing simple carbohydrates, increasing vegetables, increasing water intake, no skipping meals, meal planning and cooking strategies, keeping healthy foods in the home, ways to avoid boredom eating, and planning for success.  Julia Booth has agreed to follow-up with our clinic in 4 weeks. She was informed of the importance of frequent follow-up visits to maximize her success with intensive lifestyle modifications for her multiple health conditions.   Julia Booth was informed we would discuss her lab results at her next visit unless there is a critical issue that needs to be addressed sooner. Julia Booth agreed to keep her next visit at the agreed upon time to discuss these results.  Objective:   Blood pressure 122/75, pulse 84, temperature 98.3 F (36.8 C), height 5' 5 (1.651 m), weight (!) 313 lb (142 kg), SpO2 100%. Body mass index is 52.09 kg/m.  General: Cooperative, alert, well developed, in no acute distress. HEENT: Conjunctivae and lids  unremarkable. Cardiovascular: Regular rhythm.  Lungs: Normal work of breathing. Neurologic: No focal deficits.   Lab Results  Component Value Date   CREATININE 0.65 06/04/2023   BUN 14 06/04/2023   NA 144 06/04/2023   K 4.6 06/04/2023   CL 105 06/04/2023   CO2 24 06/04/2023   Lab Results  Component Value Date   ALT 31 06/04/2023   AST 24 06/04/2023   ALKPHOS 142 (H) 06/04/2023   BILITOT 0.3 06/04/2023   Lab Results  Component Value Date   HGBA1C 5.9 (H) 06/04/2023   HGBA1C 5.9 (H) 05/09/2022   HGBA1C 5.9 (H) 11/22/2021   HGBA1C 6.0 (H) 05/08/2021   Lab Results  Component Value Date   INSULIN  15.4 06/04/2023   INSULIN  11.6 05/09/2022   INSULIN  17.8 11/22/2021   Lab Results  Component Value Date   TSH 1.030 11/22/2021   Lab Results  Component Value Date   CHOL 173 06/04/2023   HDL 66 06/04/2023   LDLCALC 99 06/04/2023   TRIG 36 06/04/2023   CHOLHDL 2.1 08/11/2021   Lab Results  Component Value Date   VD25OH 22.8 (L) 06/04/2023   VD25OH 34.2 05/09/2022   VD25OH 25.0 (L) 11/22/2021   Lab Results  Component Value Date   WBC 3.5 06/04/2023   HGB 11.8 06/04/2023   HCT 35.8 06/04/2023   MCV 86 06/04/2023   PLT 267 06/04/2023   No results found for: IRON, TIBC, FERRITIN  Attestation Statements:   Reviewed by clinician on day of visit:  allergies, medications, problem list, medical history, surgical history, family history, social history, and previous encounter notes.  I have reviewed the above documentation for accuracy and completeness, and I agree with the above. -  Audrey Eller d. Winfred Redel, NP-C

## 2023-12-12 ENCOUNTER — Ambulatory Visit (INDEPENDENT_AMBULATORY_CARE_PROVIDER_SITE_OTHER): Payer: Self-pay | Admitting: Adult Health

## 2023-12-12 LAB — CBC WITH DIFFERENTIAL/PLATELET
Basophils Absolute: 0 x10E3/uL (ref 0.0–0.2)
Basos: 1 %
EOS (ABSOLUTE): 0 x10E3/uL (ref 0.0–0.4)
Eos: 2 %
Hematocrit: 37 % (ref 34.0–46.6)
Hemoglobin: 12.1 g/dL (ref 11.1–15.9)
Immature Grans (Abs): 0 x10E3/uL (ref 0.0–0.1)
Immature Granulocytes: 0 %
Lymphocytes Absolute: 0.9 x10E3/uL (ref 0.7–3.1)
Lymphs: 42 %
MCH: 28 pg (ref 26.6–33.0)
MCHC: 32.7 g/dL (ref 31.5–35.7)
MCV: 86 fL (ref 79–97)
Monocytes Absolute: 0.2 x10E3/uL (ref 0.1–0.9)
Monocytes: 8 %
Neutrophils Absolute: 1 x10E3/uL — ABNORMAL LOW (ref 1.4–7.0)
Neutrophils: 47 %
Platelets: 242 x10E3/uL (ref 150–450)
RBC: 4.32 x10E6/uL (ref 3.77–5.28)
RDW: 13 % (ref 11.7–15.4)
WBC: 2.1 x10E3/uL — CL (ref 3.4–10.8)

## 2023-12-12 LAB — COMPREHENSIVE METABOLIC PANEL WITH GFR
ALT: 17 IU/L (ref 0–32)
AST: 29 IU/L (ref 0–40)
Albumin: 4.3 g/dL (ref 3.8–4.9)
Alkaline Phosphatase: 99 IU/L (ref 49–135)
BUN/Creatinine Ratio: 16 (ref 9–23)
BUN: 11 mg/dL (ref 6–24)
Bilirubin Total: 0.4 mg/dL (ref 0.0–1.2)
CO2: 25 mmol/L (ref 20–29)
Calcium: 9.5 mg/dL (ref 8.7–10.2)
Chloride: 102 mmol/L (ref 96–106)
Creatinine, Ser: 0.67 mg/dL (ref 0.57–1.00)
Globulin, Total: 3.2 g/dL (ref 1.5–4.5)
Glucose: 83 mg/dL (ref 70–99)
Potassium: 4.1 mmol/L (ref 3.5–5.2)
Sodium: 142 mmol/L (ref 134–144)
Total Protein: 7.5 g/dL (ref 6.0–8.5)
eGFR: 104 mL/min/1.73 (ref >59)

## 2023-12-12 LAB — VITAMIN D 25 HYDROXY (VIT D DEFICIENCY, FRACTURES): Vit D, 25-Hydroxy: 47.2 ng/mL (ref 30.0–100.0)

## 2023-12-12 LAB — INSULIN, RANDOM: INSULIN: 16.3 u[IU]/mL (ref 2.6–24.9)

## 2023-12-12 LAB — HEMOGLOBIN A1C
Est. average glucose Bld gHb Est-mCnc: 108 mg/dL
Hgb A1c MFr Bld: 5.4 % (ref 4.8–5.6)

## 2023-12-12 LAB — TSH+FREE T4
Free T4: 1.35 ng/dL (ref 0.82–1.77)
TSH: 0.783 u[IU]/mL (ref 0.450–4.500)

## 2023-12-31 ENCOUNTER — Other Ambulatory Visit (HOSPITAL_BASED_OUTPATIENT_CLINIC_OR_DEPARTMENT_OTHER): Payer: Self-pay | Admitting: Cardiovascular Disease

## 2024-01-09 ENCOUNTER — Other Ambulatory Visit (HOSPITAL_BASED_OUTPATIENT_CLINIC_OR_DEPARTMENT_OTHER): Payer: Self-pay | Admitting: Cardiovascular Disease

## 2024-01-16 ENCOUNTER — Ambulatory Visit (INDEPENDENT_AMBULATORY_CARE_PROVIDER_SITE_OTHER): Admitting: Adult Health

## 2024-01-16 VITALS — BP 129/82 | HR 86 | Temp 98.0°F | Ht 65.0 in | Wt 311.0 lb

## 2024-01-16 DIAGNOSIS — R233 Spontaneous ecchymoses: Secondary | ICD-10-CM

## 2024-01-16 DIAGNOSIS — I10 Essential (primary) hypertension: Secondary | ICD-10-CM | POA: Diagnosis not present

## 2024-01-16 DIAGNOSIS — E559 Vitamin D deficiency, unspecified: Secondary | ICD-10-CM

## 2024-01-16 DIAGNOSIS — E669 Obesity, unspecified: Secondary | ICD-10-CM | POA: Diagnosis not present

## 2024-01-16 DIAGNOSIS — R7303 Prediabetes: Secondary | ICD-10-CM | POA: Diagnosis not present

## 2024-01-16 DIAGNOSIS — E66813 Obesity, class 3: Secondary | ICD-10-CM

## 2024-01-16 DIAGNOSIS — Z6841 Body Mass Index (BMI) 40.0 and over, adult: Secondary | ICD-10-CM

## 2024-01-16 MED ORDER — VITAMIN D (ERGOCALCIFEROL) 1.25 MG (50000 UNIT) PO CAPS: 50000.0000 [IU] | ORAL_CAPSULE | ORAL | 0 refills | Status: AC

## 2024-01-16 MED ORDER — VITAMIN D (ERGOCALCIFEROL) 1.25 MG (50000 UNIT) PO CAPS: 50000.0000 [IU] | ORAL_CAPSULE | ORAL | 0 refills | Status: DC

## 2024-01-16 MED ORDER — ZEPBOUND 5 MG/0.5ML ~~LOC~~ SOAJ: 5.0000 mg | SUBCUTANEOUS | 1 refills | Status: DC

## 2024-01-16 NOTE — Progress Notes (Signed)
 WEIGHT SUMMARY AND BIOMETRICS  Vitals Temp: 98 F (36.7 C) BP: 129/82 Pulse Rate: 86 SpO2: 97 %   Anthropometric Measurements Height: 5' 5 (1.651 m) Weight: (!) 311 lb (141.1 kg) BMI (Calculated): 51.75 Weight at Last Visit: 313 lb Weight Lost Since Last Visit: 2 lb Weight Gained Since Last Visit: 0 Starting Weight: 345 lb Total Weight Loss (lbs): 34 lb (15.4 kg)   Body Composition  Body Fat %: 59.3 % Fat Mass (lbs): 184.4 lbs Muscle Mass (lbs): 120.2 lbs Visceral Fat Rating : 23   Other Clinical Data Fasting: no Labs: no Today's Visit #: 30 Starting Date: 11/22/21    Chief Complaint:   OBESITY Julia Booth is here to discuss her progress with her obesity treatment plan.  She is on the keeping a food journal and adhering to recommended goals of 1300 calories and 90g+ protein and states she is following her eating plan approximately 80 % of the time.  She states she is exercising Walking 15 minutes 3 times per week.  Interim History:  She lives with her partner and has been keeping her daughters dog the last month. She and her partner plan on moving homes Jan 2026 She administers weekly Zepbound  5mg  on Fridays Denies mass in neck, dysphagia, dyspepsia, persistent hoarseness, abdominal pain, or N/V/C   Subjective:   1. Vitamin D  deficiency Discussed labs She is on weekly Ergocalciferol - denies N/V/Muscle Weakness   Latest Reference Range & Units 12/11/23 09:23  Vitamin D , 25-Hydroxy 30.0 - 100.0 ng/mL 47.2   Vit D level improved and just under goal of 50  2. Essential hypertension Discussed labs Initial BP above goal Repeat BP much improved 12/11/2023 CMP: Electrolytes, Liver Enzymes, and Kidney Fx- stable  3. Easy bruising Discussed labs  Latest Reference Range & Units 12/11/23 09:23  WBC 3.4 - 10.8 x10E3/uL 2.1 (LL)  RBC 3.77 - 5.28 x10E6/uL 4.32  Hemoglobin 11.1 - 15.9 g/dL 87.8  HCT 65.9 - 53.3 % 37.0  (LL): Data is critically  low  12/11/2023 TSH and Free T4 - both normal WBC low EPIC review demonstrates Leukopenia present since 2012 She denies known family hx of blood d/o She has never been evaluated by Hematology She was instructed to f/u with her established PCP- she has yet to call office  4. Prediabetes Discussed labs  Latest Reference Range & Units 12/11/23 09:23  Glucose 70 - 99 mg/dL 83  Hemoglobin J8R 4.8 - 5.6 % 5.4  Est. average glucose Bld gHb Est-mCnc mg/dL 891  INSULIN  2.6 - 24.9 uIU/mL 16.3   CBG and A1c both improved and at goal Insulin  level still above goal of 5 She is on weekly Zepbound  5mg  Denies mass in neck, dysphagia, dyspepsia, persistent hoarseness, abdominal pain, or N/V/C   Assessment/Plan:   1. Vitamin D  deficiency (Primary) Refill - Vitamin D , Ergocalciferol , (DRISDOL ) 1.25 MG (50000 UNIT) CAPS capsule; Take 1 capsule (50,000 Units total) by mouth every 7 (seven) days.  Dispense: 12 capsule; Refill: 0  2. Essential hypertension Limit Na+ intake  Increase regular cardiovascular exercise Continue  furosemide  (LASIX ) 40 MG tablet  rosuvastatin  (CRESTOR ) 10 MG tablet  carvedilol  (COREG ) 12.5 MG tablet   3. Easy bruising Follow-up with PCP, pt verbalized understanding and agreement  4. Prediabetes Refill - tirzepatide  (ZEPBOUND ) 5 MG/0.5ML Pen; Inject 5 mg into the skin once a week.  Dispense: 2 mL; Refill: 1 Increase regular cardiovascular exercise  5. Obesity,current BMI 51.8 Refill - tirzepatide  (ZEPBOUND ) 5 MG/0.5ML  Pen; Inject 5 mg into the skin once a week.  Dispense: 2 mL; Refill: 1 Increase regular cardiovascular exercise  Julia Booth is currently in the action stage of change. As such, her goal is to continue with weight loss efforts. She has agreed to keeping a food journal and adhering to recommended goals of 1300 calories and 90g+ protein.   Exercise goals: For substantial health benefits, adults should do at least 150 minutes (2 hours and 30 minutes) a  week of moderate-intensity, or 75 minutes (1 hour and 15 minutes) a week of vigorous-intensity aerobic physical activity, or an equivalent combination of moderate- and vigorous-intensity aerobic activity. Aerobic activity should be performed in episodes of at least 10 minutes, and preferably, it should be spread throughout the week.  Behavioral modification strategies: increasing lean protein intake, decreasing simple carbohydrates, increasing vegetables, increasing water intake, meal planning and cooking strategies, keeping healthy foods in the home, ways to avoid boredom eating, and planning for success.  Julia Booth has agreed to follow-up with our clinic in 4 weeks. She was informed of the importance of frequent follow-up visits to maximize her success with intensive lifestyle modifications for her multiple health conditions.   Objective:   Blood pressure 129/82, pulse 86, temperature 98 F (36.7 C), height 5' 5 (1.651 m), weight (!) 311 lb (141.1 kg), SpO2 97%. Body mass index is 51.75 kg/m.  General: Cooperative, alert, well developed, in no acute distress. HEENT: Conjunctivae and lids unremarkable. Cardiovascular: Regular rhythm.  Lungs: Normal work of breathing. Neurologic: No focal deficits.   Lab Results  Component Value Date   CREATININE 0.67 12/11/2023   BUN 11 12/11/2023   NA 142 12/11/2023   K 4.1 12/11/2023   CL 102 12/11/2023   CO2 25 12/11/2023   Lab Results  Component Value Date   ALT 17 12/11/2023   AST 29 12/11/2023   ALKPHOS 99 12/11/2023   BILITOT 0.4 12/11/2023   Lab Results  Component Value Date   HGBA1C 5.4 12/11/2023   HGBA1C 5.9 (H) 06/04/2023   HGBA1C 5.9 (H) 05/09/2022   HGBA1C 5.9 (H) 11/22/2021   HGBA1C 6.0 (H) 05/08/2021   Lab Results  Component Value Date   INSULIN  16.3 12/11/2023   INSULIN  15.4 06/04/2023   INSULIN  11.6 05/09/2022   INSULIN  17.8 11/22/2021   Lab Results  Component Value Date   TSH 0.783 12/11/2023   Lab Results   Component Value Date   CHOL 173 06/04/2023   HDL 66 06/04/2023   LDLCALC 99 06/04/2023   TRIG 36 06/04/2023   CHOLHDL 2.1 08/11/2021   Lab Results  Component Value Date   VD25OH 47.2 12/11/2023   VD25OH 22.8 (L) 06/04/2023   VD25OH 34.2 05/09/2022   Lab Results  Component Value Date   WBC 2.1 (LL) 12/11/2023   HGB 12.1 12/11/2023   HCT 37.0 12/11/2023   MCV 86 12/11/2023   PLT 242 12/11/2023   No results found for: IRON, TIBC, FERRITIN  Attestation Statements:   Reviewed by clinician on day of visit: allergies, medications, problem list, medical history, surgical history, family history, social history, and previous encounter notes.  I have reviewed the above documentation for accuracy and completeness, and I agree with the above. -  Julia Booth d. Julia Sookdeo, NP-C

## 2024-02-18 ENCOUNTER — Other Ambulatory Visit (INDEPENDENT_AMBULATORY_CARE_PROVIDER_SITE_OTHER): Payer: Self-pay | Admitting: Adult Health

## 2024-02-18 ENCOUNTER — Ambulatory Visit (INDEPENDENT_AMBULATORY_CARE_PROVIDER_SITE_OTHER): Admitting: Adult Health

## 2024-02-18 ENCOUNTER — Encounter (INDEPENDENT_AMBULATORY_CARE_PROVIDER_SITE_OTHER): Payer: Self-pay | Admitting: Adult Health

## 2024-02-18 VITALS — BP 114/75 | HR 87 | Temp 98.2°F | Ht 65.0 in | Wt 308.0 lb

## 2024-02-18 DIAGNOSIS — Z6841 Body Mass Index (BMI) 40.0 and over, adult: Secondary | ICD-10-CM | POA: Diagnosis not present

## 2024-02-18 DIAGNOSIS — G4733 Obstructive sleep apnea (adult) (pediatric): Secondary | ICD-10-CM | POA: Diagnosis not present

## 2024-02-18 DIAGNOSIS — R7303 Prediabetes: Secondary | ICD-10-CM | POA: Diagnosis not present

## 2024-02-18 DIAGNOSIS — E66813 Obesity, class 3: Secondary | ICD-10-CM

## 2024-02-18 DIAGNOSIS — R52 Pain, unspecified: Secondary | ICD-10-CM

## 2024-02-18 DIAGNOSIS — M329 Systemic lupus erythematosus, unspecified: Secondary | ICD-10-CM

## 2024-02-18 DIAGNOSIS — E669 Obesity, unspecified: Secondary | ICD-10-CM

## 2024-02-18 MED ORDER — ZEPBOUND 7.5 MG/0.5ML ~~LOC~~ SOAJ: 7.5000 mg | SUBCUTANEOUS | 0 refills | Status: AC

## 2024-02-18 NOTE — Progress Notes (Signed)
 "    WEIGHT SUMMARY AND BIOMETRICS  Vitals Temp: 98.2 F (36.8 C) BP: 114/75 Pulse Rate: 87 SpO2: 100 %   Anthropometric Measurements Height: 5' 5 (1.651 m) Weight: (!) 308 lb (139.7 kg) BMI (Calculated): 51.25 Weight at Last Visit: 311lb Weight Lost Since Last Visit: 3lb Weight Gained Since Last Visit: 0lb Starting Weight: 345lb Total Weight Loss (lbs): 37 lb (16.8 kg)   Body Composition  Body Fat %: 58 % Fat Mass (lbs): 178.8 lbs Muscle Mass (lbs): 122.8 lbs Visceral Fat Rating : 22   Other Clinical Data Fasting: no Labs: no Today's Visit #: 31 Starting Date: 11/22/21    Chief Complaint:   OBESITY Julia Booth is here to discuss her progress with her obesity treatment plan.  She is on the keeping a food journal and adhering to recommended goals of 1300 calories and 85g+ protein and states she is following her eating plan approximately 70 % of the time.  She states she is exercising Walking 15 minutes 2 times per week.  Interim History:  02/18/2024: Chronic f/u with PCP/Dr. [facility]  PCP referred to [facility] Julia Booth has had diffuse pain for years. PDMP reviewed-  11/04/2023 11/04/2023  1 Tramadol Hcl 50 Mg Tablet 540.00 90   She has been on weekly Zepbound  5.mg since last July 2025- tolerating well with recent breakthrough polyphagia  Subjective:   1. Diffuse pain Julia Booth reports pain in the following locations: Cervical Neck Lumbar Back Bil elbows Bil hands Bil knees Most recent pain source, bil feet  Currently on OTC analgesic plus Tramadol 50mg  PRN PCP referred her yesterday to [facility]  2. Prediabetes She started on weekly Zepbound  2.5mg  on/about 07/10/2023 Zepbound  2.5mg  increased to 5mg  on/about 09/03/2023 She reports breakthrough polyphagia Discussed risks/benefits of increasing GIP/GLP- 1 therapy  3. OSA (obstructive sleep apnea) She wears CPAP nightly, at least 7 hrs per night She denies morning HA or excessive  daytime fatigue  Assessment/Plan:   1. Diffuse pain Remain active as tolerated Establish with Pain [facility]  2. Prediabetes (Primary) Limit simple CHO and sugar Increase lean protein Remain active as tolerated  3. OSA (obstructive sleep apnea) Continue with weight loss efforts Continue nightly CPAP  4. Obesity,current BMI 51.3 Refill and INCREASE  tirzepatide  (ZEPBOUND ) 7.5 MG/0.5ML Pen Inject 7.5 mg into the skin once a week. Dispense: 2 mL, Refills: 0 ordered   Julia Booth is currently in the action stage of change. As such, her goal is to continue with weight loss efforts. She has agreed to keeping a food journal and adhering to recommended goals of 1300 calories and 85g+ protein.   Exercise goals: All adults should avoid inactivity. Some physical activity is better than none, and adults who participate in any amount of physical activity gain some health benefits. Adults should also include muscle-strengthening activities that involve all major muscle groups on 2 or more days a week.  Behavioral modification strategies: increasing lean protein intake, decreasing simple carbohydrates, increasing vegetables, increasing water intake, no skipping meals, meal planning and cooking strategies, keeping healthy foods in the home, ways to avoid boredom eating, and planning for success.  Julia Booth has agreed to follow-up with our clinic in 4 weeks. She was informed of the importance of frequent follow-up visits to maximize her success with intensive lifestyle modifications for her multiple health conditions.   Check Fasting Labs late winter/early spring 2026  Objective:   Blood pressure 114/75, pulse 87, temperature 98.2 F (36.8 C), height 5'  5 (1.651 m), weight (!) 308 lb (139.7 kg), SpO2 100%. Body mass index is 51.25 kg/m.  General: Cooperative, alert, well developed, in no acute distress. HEENT: Conjunctivae and lids unremarkable. Cardiovascular: Regular rhythm.  Lungs: Normal work  of breathing. Neurologic: No focal deficits.   Lab Results  Component Value Date   CREATININE 0.67 12/11/2023   BUN 11 12/11/2023   NA 142 12/11/2023   K 4.1 12/11/2023   CL 102 12/11/2023   CO2 25 12/11/2023   Lab Results  Component Value Date   ALT 17 12/11/2023   AST 29 12/11/2023   ALKPHOS 99 12/11/2023   BILITOT 0.4 12/11/2023   Lab Results  Component Value Date   HGBA1C 5.4 12/11/2023   HGBA1C 5.9 (H) 06/04/2023   HGBA1C 5.9 (H) 05/09/2022   HGBA1C 5.9 (H) 11/22/2021   HGBA1C 6.0 (H) 05/08/2021   Lab Results  Component Value Date   INSULIN  16.3 12/11/2023   INSULIN  15.4 06/04/2023   INSULIN  11.6 05/09/2022   INSULIN  17.8 11/22/2021   Lab Results  Component Value Date   TSH 0.783 12/11/2023   Lab Results  Component Value Date   CHOL 173 06/04/2023   HDL 66 06/04/2023   LDLCALC 99 06/04/2023   TRIG 36 06/04/2023   CHOLHDL 2.1 08/11/2021   Lab Results  Component Value Date   VD25OH 47.2 12/11/2023   VD25OH 22.8 (L) 06/04/2023   VD25OH 34.2 05/09/2022   Lab Results  Component Value Date   WBC 2.1 (LL) 12/11/2023   HGB 12.1 12/11/2023   HCT 37.0 12/11/2023   MCV 86 12/11/2023   PLT 242 12/11/2023   No results found for: IRON, TIBC, FERRITIN  Attestation Statements:   Reviewed by clinician on day of visit: allergies, medications, problem list, medical history, surgical history, family history, social history, and previous encounter notes.  I have reviewed the above documentation for accuracy and completeness, and I agree with the above. -  Elyanna Wallick d. Capricia Serda, NP-C "

## 2024-03-18 ENCOUNTER — Ambulatory Visit (INDEPENDENT_AMBULATORY_CARE_PROVIDER_SITE_OTHER): Admitting: Adult Health
